# Patient Record
Sex: Male | Born: 1955 | Race: White | Hispanic: No | Marital: Single | Smoking: Former smoker
Health system: Southern US, Community
[De-identification: ages and names within clinical notes are randomized; demographics above are authoritative.]

## PROBLEM LIST (undated history)

## (undated) DIAGNOSIS — S2239XA Fracture of one rib, unspecified side, initial encounter for closed fracture: Secondary | ICD-10-CM

## (undated) DIAGNOSIS — K4090 Unilateral inguinal hernia, without obstruction or gangrene, not specified as recurrent: Secondary | ICD-10-CM

## (undated) DIAGNOSIS — Z8042 Family history of malignant neoplasm of prostate: Secondary | ICD-10-CM

## (undated) DIAGNOSIS — K567 Ileus, unspecified: Secondary | ICD-10-CM

## (undated) DIAGNOSIS — Z87448 Personal history of other diseases of urinary system: Secondary | ICD-10-CM

## (undated) DIAGNOSIS — M109 Gout, unspecified: Secondary | ICD-10-CM

## (undated) DIAGNOSIS — K5792 Diverticulitis of intestine, part unspecified, without perforation or abscess without bleeding: Secondary | ICD-10-CM

## (undated) DIAGNOSIS — G4730 Sleep apnea, unspecified: Secondary | ICD-10-CM

## (undated) DIAGNOSIS — H9190 Unspecified hearing loss, unspecified ear: Secondary | ICD-10-CM

## (undated) DIAGNOSIS — Z820 Family history of epilepsy and other diseases of the nervous system: Secondary | ICD-10-CM

## (undated) DIAGNOSIS — I10 Essential (primary) hypertension: Secondary | ICD-10-CM

## (undated) DIAGNOSIS — N529 Male erectile dysfunction, unspecified: Secondary | ICD-10-CM

## (undated) DIAGNOSIS — I712 Thoracic aortic aneurysm, without rupture: Secondary | ICD-10-CM

## (undated) DIAGNOSIS — E663 Overweight: Secondary | ICD-10-CM

## (undated) DIAGNOSIS — R7303 Prediabetes: Secondary | ICD-10-CM

## (undated) DIAGNOSIS — K219 Gastro-esophageal reflux disease without esophagitis: Secondary | ICD-10-CM

## (undated) HISTORY — DX: Unilateral inguinal hernia, without obstruction or gangrene, not specified as recurrent: K40.90

## (undated) HISTORY — DX: Gout, unspecified: M10.9

## (undated) HISTORY — DX: Family history of epilepsy and other diseases of the nervous system: Z82.0

## (undated) HISTORY — DX: Prediabetes: R73.03

## (undated) HISTORY — DX: Personal history of other diseases of urinary system: Z87.448

## (undated) HISTORY — DX: Family history of malignant neoplasm of prostate: Z80.42

## (undated) HISTORY — DX: Sleep apnea, unspecified: G47.30

## (undated) HISTORY — DX: Overweight: E66.3

## (undated) HISTORY — PX: URETHRA SURGERY: SHX824

## (undated) HISTORY — DX: Male erectile dysfunction, unspecified: N52.9

## (undated) HISTORY — DX: Unspecified hearing loss, unspecified ear: H91.90

---
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
## 2007-03-20 ENCOUNTER — Ambulatory Visit: Payer: Self-pay | Admitting: Urology

## 2008-12-06 ENCOUNTER — Emergency Department: Payer: Self-pay | Admitting: Emergency Medicine

## 2008-12-09 ENCOUNTER — Emergency Department: Payer: Self-pay | Admitting: Internal Medicine

## 2008-12-09 ENCOUNTER — Emergency Department: Payer: Self-pay | Admitting: Emergency Medicine

## 2012-07-03 ENCOUNTER — Ambulatory Visit: Payer: Self-pay | Admitting: General Practice

## 2012-08-12 ENCOUNTER — Ambulatory Visit: Payer: Self-pay | Admitting: General Practice

## 2013-07-09 ENCOUNTER — Ambulatory Visit: Payer: Self-pay | Admitting: Unknown Physician Specialty

## 2013-07-09 LAB — HM COLONOSCOPY

## 2013-07-12 LAB — PATHOLOGY REPORT

## 2016-01-24 LAB — LIPID PANEL
Cholesterol: 198 (ref 0–200)
HDL: 40 (ref 35–70)
LDL Cholesterol: 118
Triglycerides: 202 — AB (ref 40–160)

## 2016-01-24 LAB — PSA: PSA: 0.3

## 2016-08-05 ENCOUNTER — Emergency Department: Payer: BLUE CROSS/BLUE SHIELD

## 2016-08-05 ENCOUNTER — Encounter: Payer: Self-pay | Admitting: Intensive Care

## 2016-08-05 ENCOUNTER — Observation Stay
Admission: EM | Admit: 2016-08-05 | Discharge: 2016-08-08 | Disposition: A | Discharge status: Skilled Nursing Facility | Payer: BLUE CROSS/BLUE SHIELD | Attending: Surgery | Admitting: Surgery

## 2016-08-05 DIAGNOSIS — I10 Essential (primary) hypertension: Secondary | ICD-10-CM | POA: Diagnosis not present

## 2016-08-05 DIAGNOSIS — Z87891 Personal history of nicotine dependence: Secondary | ICD-10-CM | POA: Diagnosis not present

## 2016-08-05 DIAGNOSIS — S2243XA Multiple fractures of ribs, bilateral, initial encounter for closed fracture: Principal | ICD-10-CM

## 2016-08-05 DIAGNOSIS — W11XXXA Fall on and from ladder, initial encounter: Secondary | ICD-10-CM | POA: Insufficient documentation

## 2016-08-05 DIAGNOSIS — K219 Gastro-esophageal reflux disease without esophagitis: Secondary | ICD-10-CM | POA: Diagnosis not present

## 2016-08-05 DIAGNOSIS — S32009A Unspecified fracture of unspecified lumbar vertebra, initial encounter for closed fracture: Secondary | ICD-10-CM

## 2016-08-05 DIAGNOSIS — M62838 Other muscle spasm: Secondary | ICD-10-CM

## 2016-08-05 DIAGNOSIS — I712 Thoracic aortic aneurysm, without rupture: Secondary | ICD-10-CM | POA: Diagnosis not present

## 2016-08-05 DIAGNOSIS — W19XXXA Unspecified fall, initial encounter: Secondary | ICD-10-CM

## 2016-08-05 DIAGNOSIS — S2239XA Fracture of one rib, unspecified side, initial encounter for closed fracture: Secondary | ICD-10-CM

## 2016-08-05 DIAGNOSIS — S2249XA Multiple fractures of ribs, unspecified side, initial encounter for closed fracture: Secondary | ICD-10-CM

## 2016-08-05 HISTORY — DX: Essential (primary) hypertension: I10

## 2016-08-05 HISTORY — DX: Diverticulitis of intestine, part unspecified, without perforation or abscess without bleeding: K57.92

## 2016-08-05 LAB — COMPREHENSIVE METABOLIC PANEL
ALT: 37 U/L (ref 17–63)
ANION GAP: 8 (ref 5–15)
AST: 60 U/L — ABNORMAL HIGH (ref 15–41)
Albumin: 4 g/dL (ref 3.5–5.0)
Alkaline Phosphatase: 71 U/L (ref 38–126)
BILIRUBIN TOTAL: 0.7 mg/dL (ref 0.3–1.2)
BUN: 21 mg/dL — ABNORMAL HIGH (ref 6–20)
CO2: 24 mmol/L (ref 22–32)
Calcium: 8.4 mg/dL — ABNORMAL LOW (ref 8.9–10.3)
Chloride: 104 mmol/L (ref 101–111)
Creatinine, Ser: 1.35 mg/dL — ABNORMAL HIGH (ref 0.61–1.24)
GFR, EST NON AFRICAN AMERICAN: 56 mL/min — AB (ref >60)
Glucose, Bld: 120 mg/dL — ABNORMAL HIGH (ref 65–99)
POTASSIUM: 3.8 mmol/L (ref 3.5–5.1)
Sodium: 136 mmol/L (ref 135–145)
TOTAL PROTEIN: 7.5 g/dL (ref 6.5–8.1)

## 2016-08-05 LAB — CBC WITH DIFFERENTIAL/PLATELET
BASOS PCT: 0 %
Basophils Absolute: 0 10*3/uL (ref 0–0.1)
Eosinophils Absolute: 0.2 10*3/uL (ref 0–0.7)
Eosinophils Relative: 1 %
HEMATOCRIT: 43.3 % (ref 40.0–52.0)
Hemoglobin: 14.8 g/dL (ref 13.0–18.0)
LYMPHS PCT: 8 %
Lymphs Abs: 1.3 10*3/uL (ref 1.0–3.6)
MCH: 29.8 pg (ref 26.0–34.0)
MCHC: 34.3 g/dL (ref 32.0–36.0)
MCV: 86.9 fL (ref 80.0–100.0)
MONO ABS: 0.9 10*3/uL (ref 0.2–1.0)
MONOS PCT: 6 %
NEUTROS ABS: 13.9 10*3/uL — AB (ref 1.4–6.5)
Neutrophils Relative %: 85 %
Platelets: 193 10*3/uL (ref 150–440)
RBC: 4.98 MIL/uL (ref 4.40–5.90)
RDW: 12.4 % (ref 11.5–14.5)
WBC: 16.4 10*3/uL — ABNORMAL HIGH (ref 3.8–10.6)

## 2016-08-05 MED ORDER — OXYCODONE-ACETAMINOPHEN 7.5-325 MG PO TABS: 1.0000 | ORAL_TABLET | ORAL | 0 refills | Status: DC | PRN

## 2016-08-05 MED ORDER — DIAZEPAM 5 MG PO TABS: 5.0000 mg | ORAL_TABLET | Freq: Three times a day (TID) | ORAL | 0 refills | Status: DC | PRN

## 2016-08-05 MED ORDER — HYDROMORPHONE HCL 1 MG/ML IJ SOLN
0.5000 mg | Freq: Once | INTRAMUSCULAR | Status: AC
  Administered 2016-08-05: 0.5 mg via INTRAVENOUS
  Filled 2016-08-05: qty 1

## 2016-08-05 MED ORDER — KETOROLAC TROMETHAMINE 30 MG/ML IJ SOLN
30.0000 mg | Freq: Once | INTRAMUSCULAR | Status: AC
  Administered 2016-08-05: 30 mg via INTRAVENOUS
  Filled 2016-08-05: qty 1

## 2016-08-05 MED ORDER — DEXAMETHASONE SODIUM PHOSPHATE 10 MG/ML IJ SOLN
10.0000 mg | Freq: Once | INTRAMUSCULAR | Status: AC
Start: 2016-08-05 — End: 2016-08-05
  Administered 2016-08-05: 10 mg via INTRAVENOUS
  Filled 2016-08-05: qty 1

## 2016-08-05 MED ORDER — SODIUM CHLORIDE 0.9 % IV SOLN
Freq: Once | INTRAVENOUS | Status: DC
  Administered 2016-08-06: 01:00:00 via INTRAVENOUS

## 2016-08-05 MED ORDER — HYDROMORPHONE HCL 1 MG/ML IJ SOLN
1.0000 mg | Freq: Once | INTRAMUSCULAR | Status: AC
  Administered 2016-08-05: 1 mg via INTRAVENOUS
  Filled 2016-08-05: qty 1

## 2016-08-05 MED ORDER — DIAZEPAM 5 MG PO TABS
10.0000 mg | ORAL_TABLET | Freq: Once | ORAL | Status: AC
  Administered 2016-08-05: 10 mg via ORAL
  Filled 2016-08-05: qty 2

## 2016-08-05 MED ORDER — IOPAMIDOL (ISOVUE-300) INJECTION 61%
75.0000 mL | Freq: Once | INTRAVENOUS | Status: AC | PRN
  Administered 2016-08-05: 75 mL via INTRAVENOUS

## 2016-08-05 MED ORDER — IBUPROFEN 800 MG PO TABS: 800.0000 mg | ORAL_TABLET | Freq: Three times a day (TID) | ORAL | 0 refills | Status: DC | PRN

## 2016-08-05 NOTE — ED Provider Notes (Addendum)
[facility] ([place]) Emergency Department Provider Note        Time seen: ----------------------------------------- 5:40 PM on 08/05/2016 -----------------------------------------    I have reviewed the triage vital signs and the nursing notes.   HISTORY  Chief Complaint Fall and Loss of Consciousness    HPI Arthur Koch is a 61 y.o. male who presents to ER after a fall from a ladder.Patient states that he slipped and caused him to fall. Patient states he landed flat on his back he is complaining of pain in the middle of his back. Patient fell and thinks he might of have loss of consciousness but is not sure how long he was out. He woke and crawled to his house and called 911. He denies any neck pain. 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 He has no radicular symptoms or decreased sensation.   No past medical history on file.  There are no active problems to display for this patient.   No past surgical history on file.  Allergies Patient has no allergy information on record.  Social History Social History  Substance Use Topics  . Smoking status: Not on file  . Smokeless tobacco: Not on file  . Alcohol use Not on file    Review of Systems Constitutional: Negative for fever. Cardiovascular: Negative for chest pain. Respiratory: Negative for shortness of breath. Gastrointestinal: Negative for abdominal pain, vomiting and diarrhea. Genitourinary: Negative for dysuria. Musculoskeletal: Positive for back pain Skin: Negative for rash. Neurological: Negative for headaches, focal weakness or numbness.  10-point ROS otherwise negative.  ____________________________________________   PHYSICAL EXAM:  VITAL SIGNS: ED Triage Vitals [08/05/16 1739]  Enc Vitals Group     BP 136/87     Pulse Rate 79     Resp (!) 94     Temp 98.1 F (36.7 C)     Temp Source Oral     SpO2 94 %     Weight      Height      Head Circumference    Peak Flow      Pain Score      Pain Loc      Pain Edu?      Excl. in [place]?     Constitutional: Alert and oriented. Well appearing and in no distress. Eyes: Conjunctivae are normal. PERRL. Normal extraocular movements. ENT   Head: Normocephalic and atraumatic.   Nose: No congestion/rhinnorhea.   Mouth/Throat: Mucous membranes are moist.   Neck: No stridor. Cardiovascular: Normal rate, regular rhythm. No murmurs, rubs, or gallops. Respiratory: Normal respiratory effort without tachypnea nor retractions. Breath sounds are clear and equal bilaterally. No wheezes/rales/rhonchi. Gastrointestinal: Soft and nontender. Normal bowel sounds Musculoskeletal: Nontender with normal range of motion in all extremities. No lower extremity tenderness nor edema. Mid back tenderness. Negative crossover straight leg raise examination. Neurologic:  Normal speech and language. No gross focal neurologic deficits are appreciated. Strength and sensation appear to be normal Skin:  Skin is warm, dry and intact. No rash noted. Psychiatric: Mood and affect are normal. Speech and behavior are normal.  ____________________________________________  ED COURSE:  Pertinent labs & imaging results that were available during my care of the patient were reviewed by me and considered in my medical decision making (see chart for details). Patient presents to ER after mechanical fall. We will assess with imaging and provide IV pain medicine.  EKG: Interpreted by me, sinus rhythm rate 89 bpm, normal PR interval, normal QRS,  normal QT, leftward axis, possible old inferior infarct   Procedures ____________________________________________   LABS (pertinent positives/negatives)  Labs Reviewed  CBC WITH DIFFERENTIAL/PLATELET - Abnormal; Notable for the following:       Result Value   WBC 16.4 (*)    Neutro Abs 13.9 (*)    All other components within normal limits  COMPREHENSIVE METABOLIC PANEL - Abnormal; Notable  for the following:    Glucose, Bld 120 (*)    BUN 21 (*)    Creatinine, Ser 1.35 (*)    Calcium 8.4 (*)    AST 60 (*)    GFR calc non Af Amer 56 (*)    All other components within normal limits    RADIOLOGY Images were viewed by me  CT head, thoracic and lumbar spine x-rays and CT scans  Did not reveal any acute findings IMPRESSION: 1. Nondisplaced fractures of the left transverse process of L3 and the right transverse process of L4. 2. No other fractures. No other acute abnormalities. IMPRESSION: 1. Multiple bilateral rib fractures. No pneumothorax. No other acute/traumatic intrathoracic pathology. 2. Mild dilatation of the ascending aorta measuring up to 4.4 cm. Ascending thoracic aortic aneurysm. Recommend semi-annual imaging followup by CTA or MRA and referral to cardiothoracic surgery if not already obtained. This recommendation follows 2010 ACCF/AHA/AATS/ACR/ASA/SCA/SCAI/SIR/STS/SVM Guidelines for the Diagnosis and Management of Patients With Thoracic Aortic Disease. Circulation. 2010; 121: [id number] ____________________________________________  FINAL ASSESSMENT AND PLAN  Fall, back pain, muscle spasm, contusion, transverse process fractures, Rib fractures  Plan: Patient with labs and imaging as dictated above. Labs and imaging are reassuring. He was given IV Dilaudid and oral Valium. We have attempted to ambulate in the halls.  Patient required further CT imaging because he was having trouble walking. CT scan showed transverse process fractures and bilateral rib fractures. Patient is requesting admission here rather than transfer to a trauma center Earleen Newport, MD   Note: This note was generated in part or whole with voice recognition software. Voice recognition is usually quite accurate but there are transcription errors that can and very often do occur. I apologize for any typographical errors that were not detected and corrected.     Earleen Newport, MD 08/05/16 2137    Earleen Newport, MD 08/06/16 [id number]    Earleen Newport, MD 08/06/16 [phone]

## 2016-08-05 NOTE — ED Notes (Signed)
Pt refuses to get up at this time. Moaning and groaning. Pt reports "when I take a breath it hurts so bad in my back. I cannot take a good breath due to pain" MD made aware. See mar for medication

## 2016-08-05 NOTE — ED Notes (Signed)
Patient returned from CT without incident.  Will continue to monitor

## 2016-08-05 NOTE — ED Notes (Signed)
Patient transported to X-ray 

## 2016-08-05 NOTE — ED Notes (Signed)
Patient to CT with radiology tech via stretcher. Patient alert and oriented on departure.

## 2016-08-05 NOTE — ED Notes (Signed)
Patient transported to CT 

## 2016-08-05 NOTE — ED Triage Notes (Signed)
PAtient arrived by EMS from home for a fall from a ladder. Patient states "The ladder slipped and caused me to fall. I fell flat on my back" only c/o pain in mid center back. When patient fell he had a LOC and it was an unknown time of how long he was out. Patient woke and crawled to house and called 911. Abrasions present on arms. Denies neck pain. Takes baby aspirin daily.HX HTN and X3 ruptured discs. A&O x3 upon arrival. EMS vitals 150/90, HR 90s, 97O2, bloos sugar 115

## 2016-08-06 ENCOUNTER — Observation Stay: Payer: BLUE CROSS/BLUE SHIELD

## 2016-08-06 DIAGNOSIS — S2243XA Multiple fractures of ribs, bilateral, initial encounter for closed fracture: Secondary | ICD-10-CM | POA: Insufficient documentation

## 2016-08-06 DIAGNOSIS — W19XXXA Unspecified fall, initial encounter: Secondary | ICD-10-CM | POA: Insufficient documentation

## 2016-08-06 DIAGNOSIS — S2249XA Multiple fractures of ribs, unspecified side, initial encounter for closed fracture: Secondary | ICD-10-CM | POA: Diagnosis present

## 2016-08-06 DIAGNOSIS — S32009A Unspecified fracture of unspecified lumbar vertebra, initial encounter for closed fracture: Secondary | ICD-10-CM

## 2016-08-06 DIAGNOSIS — S2239XA Fracture of one rib, unspecified side, initial encounter for closed fracture: Secondary | ICD-10-CM | POA: Diagnosis present

## 2016-08-06 LAB — CBC
HCT: 39.8 % — ABNORMAL LOW (ref 40.0–52.0)
HEMOGLOBIN: 14.2 g/dL (ref 13.0–18.0)
MCH: 30.9 pg (ref 26.0–34.0)
MCHC: 35.7 g/dL (ref 32.0–36.0)
MCV: 86.5 fL (ref 80.0–100.0)
Platelets: 189 10*3/uL (ref 150–440)
RBC: 4.61 MIL/uL (ref 4.40–5.90)
RDW: 12.3 % (ref 11.5–14.5)
WBC: 14.1 10*3/uL — ABNORMAL HIGH (ref 3.8–10.6)

## 2016-08-06 MED ORDER — SODIUM CHLORIDE 0.9% FLUSH: 3.0000 mL | INTRAVENOUS | Status: DC | PRN

## 2016-08-06 MED ORDER — PANTOPRAZOLE SODIUM 40 MG PO TBEC
40.0000 mg | DELAYED_RELEASE_TABLET | Freq: Every day | ORAL | Status: DC
  Administered 2016-08-06 – 2016-08-08 (×3): 40 mg via ORAL
  Filled 2016-08-06 (×3): qty 1

## 2016-08-06 MED ORDER — LISINOPRIL 20 MG PO TABS
20.0000 mg | ORAL_TABLET | Freq: Every day | ORAL | Status: DC
  Administered 2016-08-06 – 2016-08-08 (×3): 20 mg via ORAL
  Filled 2016-08-06 (×3): qty 1

## 2016-08-06 MED ORDER — ASPIRIN EC 81 MG PO TBEC
81.0000 mg | DELAYED_RELEASE_TABLET | Freq: Every day | ORAL | Status: DC
  Administered 2016-08-06 – 2016-08-08 (×3): 81 mg via ORAL
  Filled 2016-08-06 (×3): qty 1

## 2016-08-06 MED ORDER — HYDROCHLOROTHIAZIDE 12.5 MG PO CAPS
12.5000 mg | ORAL_CAPSULE | Freq: Every day | ORAL | Status: DC
  Administered 2016-08-06 – 2016-08-08 (×3): 12.5 mg via ORAL
  Filled 2016-08-06 (×3): qty 1

## 2016-08-06 MED ORDER — HYDROMORPHONE HCL 1 MG/ML IJ SOLN
1.0000 mg | INTRAMUSCULAR | Status: DC | PRN
  Administered 2016-08-07 – 2016-08-08 (×3): 1 mg via INTRAVENOUS
  Filled 2016-08-06 (×3): qty 1

## 2016-08-06 MED ORDER — LISINOPRIL-HYDROCHLOROTHIAZIDE 20-12.5 MG PO TABS: 1.0000 | ORAL_TABLET | Freq: Every day | ORAL | Status: DC

## 2016-08-06 MED ORDER — GUAIFENESIN ER 600 MG PO TB12
600.0000 mg | ORAL_TABLET | Freq: Two times a day (BID) | ORAL | Status: DC | PRN
  Administered 2016-08-06 – 2016-08-07 (×3): 600 mg via ORAL
  Filled 2016-08-06 (×3): qty 1

## 2016-08-06 MED ORDER — SODIUM CHLORIDE 0.9 % IV SOLN: 250.0000 mL | INTRAVENOUS | Status: DC | PRN

## 2016-08-06 MED ORDER — PNEUMOCOCCAL VAC POLYVALENT 25 MCG/0.5ML IJ INJ: 0.5000 mL | INJECTION | INTRAMUSCULAR | Status: DC

## 2016-08-06 MED ORDER — OXYCODONE HCL 5 MG PO TABS
5.0000 mg | ORAL_TABLET | ORAL | Status: DC | PRN
  Administered 2016-08-06 – 2016-08-07 (×6): 5 mg via ORAL
  Filled 2016-08-06 (×6): qty 1

## 2016-08-06 MED ORDER — ONDANSETRON HCL 4 MG PO TABS: 4.0000 mg | ORAL_TABLET | Freq: Four times a day (QID) | ORAL | Status: DC | PRN

## 2016-08-06 MED ORDER — CYCLOBENZAPRINE HCL 10 MG PO TABS
5.0000 mg | ORAL_TABLET | Freq: Three times a day (TID) | ORAL | Status: DC | PRN
  Administered 2016-08-06 – 2016-08-08 (×4): 5 mg via ORAL
  Filled 2016-08-06 (×5): qty 1

## 2016-08-06 MED ORDER — ONDANSETRON HCL 4 MG/2ML IJ SOLN: 4.0000 mg | Freq: Four times a day (QID) | INTRAMUSCULAR | Status: DC | PRN

## 2016-08-06 MED ORDER — HEPARIN SODIUM (PORCINE) 5000 UNIT/ML IJ SOLN
5000.0000 [IU] | Freq: Three times a day (TID) | INTRAMUSCULAR | Status: DC
  Administered 2016-08-06 – 2016-08-08 (×6): 5000 [IU] via SUBCUTANEOUS
  Filled 2016-08-06 (×6): qty 1

## 2016-08-06 MED ORDER — SODIUM CHLORIDE 0.9% FLUSH
3.0000 mL | Freq: Two times a day (BID) | INTRAVENOUS | Status: DC
  Administered 2016-08-06 – 2016-08-07 (×5): 3 mL via INTRAVENOUS

## 2016-08-06 NOTE — Progress Notes (Signed)
Patient ID: Arthur Koch, male   DOB: 03/30/1956, 61 y.o.   MRN: [id number] Patient complains of significant pain on right and left sides of abdomen. Patient also complained of mucus in the back of his throat that he was unable to cough up on his own. Was given mucomist with good relief.     AVSS Tender around the anterior, lower chest on both sides. No apparent external bruising. He does not seem to be comfortable when trying to change positions or when trying to sit up.  Chest xray this morning so no acute changes.  Plan- will need to be mobilized, out of bed- the earlier the better. If he is not any better with pain control he may benefit with a pain management evaluation.  CXR ordered for tomorrow.

## 2016-08-06 NOTE — H&P (Signed)
Arthur Koch is an 61 y.o. male.    Chief Complaint: Chest pain  HPI: This a patient with bilateral posterior chest pain more on the right than left following a fall from a ladder. He's not sure if he lost consciousness but a workup in the ER had a negative CT scan of his head. Patient describes chest pain which is improved now that he's been in the emergency room. He has no shortness of breath he has no head pain or neck pain and he has no extremity pain or abdominal pain.  His only medical problem is reflux disease and hypertension.  Past surgical history is that of a right arm fracture  No family history of significant illness.  He works for [employer] this smoke but drinks 2 beers per day  Past Medical History:  Diagnosis Date  . Diverticulitis   . Hypertension     History reviewed. No pertinent surgical history.  History reviewed. No pertinent family history. Social History:  reports that he has quit smoking. He has quit using smokeless tobacco. He reports that he drinks about 2.4 oz of alcohol per week . His drug history is not on file.  Allergies: No Known Allergies   (Not in a hospital admission)   Review of Systems  Constitutional: Negative.   HENT: Negative.   Eyes: Negative.   Respiratory: Negative for cough, hemoptysis, sputum production, shortness of breath and wheezing.   Cardiovascular: Positive for chest pain. Negative for palpitations, orthopnea, claudication and leg swelling.  Gastrointestinal: Negative.   Genitourinary: Negative.   Musculoskeletal: Negative.   Skin: Negative.   Neurological: Negative.   Endo/Heme/Allergies: Negative.   Psychiatric/Behavioral: Negative.      Physical Exam:  BP 121/61   Pulse 96   Temp 98.1 F (36.7 C) (Oral)   Resp 20   Ht 5' 11.5" (1.816 m)   Wt 256 lb 1.6 oz (116.2 kg)   SpO2 97%   BMI 35.22 kg/m   Physical Exam  Constitutional: He is oriented to person, place, and time and  well-developed, well-nourished, and in no distress. No distress.  Appears comfortable not obviously short of breath nor in pain  HENT:  Head: Normocephalic and atraumatic.  Eyes: Pupils are equal, round, and reactive to light. Right eye exhibits no discharge. Left eye exhibits no discharge. No scleral icterus.  Neck: Normal range of motion.  Cardiovascular: Normal rate, regular rhythm and normal heart sounds.   Pulmonary/Chest: Effort normal and breath sounds normal. No respiratory distress. He has no wheezes. He has no rales. He exhibits tenderness.  Abdominal: Soft. He exhibits no distension. There is no tenderness.  Musculoskeletal: Normal range of motion. He exhibits no edema or tenderness.  Lymphadenopathy:    He has no cervical adenopathy.  Neurological: He is alert and oriented to person, place, and time.  Skin: Skin is warm and dry. He is not diaphoretic. No erythema.  Right arm abrasions  Psychiatric: Mood and affect normal.  Vitals reviewed.       Results for orders placed or performed during the hospital encounter of 08/05/16 (from the past 48 hour(s))  CBC with Differential/Platelet     Status: Abnormal   Collection Time: 08/05/16  5:45 PM  Result Value Ref Range   WBC 16.4 (H) 3.8 - 10.6 K/uL   RBC 4.98 4.40 - 5.90 MIL/uL   Hemoglobin 14.8 13.0 - 18.0 g/dL   HCT 43.3 40.0 - 52.0 %   MCV 86.9 80.0 -  100.0 fL   MCH 29.8 26.0 - 34.0 pg   MCHC 34.3 32.0 - 36.0 g/dL   RDW 12.4 11.5 - 14.5 %   Platelets 193 150 - 440 K/uL   Neutrophils Relative % 85 %   Neutro Abs 13.9 (H) 1.4 - 6.5 K/uL   Lymphocytes Relative 8 %   Lymphs Abs 1.3 1.0 - 3.6 K/uL   Monocytes Relative 6 %   Monocytes Absolute 0.9 0.2 - 1.0 K/uL   Eosinophils Relative 1 %   Eosinophils Absolute 0.2 0 - 0.7 K/uL   Basophils Relative 0 %   Basophils Absolute 0.0 0 - 0.1 K/uL  Comprehensive metabolic panel     Status: Abnormal   Collection Time: 08/05/16  5:45 PM  Result Value Ref Range   Sodium 136  135 - 145 mmol/L   Potassium 3.8 3.5 - 5.1 mmol/L   Chloride 104 101 - 111 mmol/L   CO2 24 22 - 32 mmol/L   Glucose, Bld 120 (H) 65 - 99 mg/dL   BUN 21 (H) 6 - 20 mg/dL   Creatinine, Ser 1.35 (H) 0.61 - 1.24 mg/dL   Calcium 8.4 (L) 8.9 - 10.3 mg/dL   Total Protein 7.5 6.5 - 8.1 g/dL   Albumin 4.0 3.5 - 5.0 g/dL   AST 60 (H) 15 - 41 U/L   ALT 37 17 - 63 U/L   Alkaline Phosphatase 71 38 - 126 U/L   Total Bilirubin 0.7 0.3 - 1.2 mg/dL   GFR calc non Af Amer 56 (L) >60 mL/min   GFR calc Af Amer >60 >60 mL/min    Comment: (NOTE) The eGFR has been calculated using the CKD EPI equation. This calculation has not been validated in all clinical situations. eGFR's persistently <60 mL/min signify possible Chronic Kidney Disease.    Anion gap 8 5 - 15   Dg Chest 1 View  Result Date: 08/05/2016 CLINICAL DATA:  Back pain after fall off a ladder EXAM: CHEST 1 VIEW COMPARISON:  08/05/2016 FINDINGS: Low lung volumes. No acute infiltrate or effusion. Cardiomediastinal silhouette within normal limits. No pneumothorax. IMPRESSION: Low lung volumes. No radiographic evidence for acute cardiopulmonary abnormality. Electronically Signed   By: Donavan Foil M.D.   On: 08/05/2016 22:57   Dg Thoracic Spine 2 View  Result Date: 08/05/2016 CLINICAL DATA:  [place] about 67f off ladder, landed flat on his back. Mid>lower back pain without leg pain or tingling.No previous injury to lower back EXAM: THORACIC SPINE 2 VIEWS COMPARISON:  None. FINDINGS: No fracture. No spondylolisthesis. There are endplate osteophytes along the mid to lower thoracic spine. No bone lesion. The soft tissues are unremarkable. IMPRESSION: No fracture, spondylolisthesis or acute finding. Electronically Signed   By: DLajean ManesM.D.   On: 08/05/2016 18:29   Dg Lumbar Spine 2-3 Views  Result Date: 08/05/2016 CLINICAL DATA:  [place] 848f[place], landed flat on his back. Mid>lower back pain without leg pain or tingling.No previous injury  to lower back EXAM: LUMBAR SPINE - 2-3 VIEW COMPARISON:  Lumbar MRI, 08/12/2012 FINDINGS: No acute fracture.  No bone lesion.  No spondylolisthesis. Mild loss disc height at L2-L3 and L3-L4. Moderate loss disc height at L4-L5 and L5-S1. There are minor endplate osteophytes most evident at L5-S1. Facet degenerative changes noted at L5-S1. The soft tissues are unremarkable. IMPRESSION: No fracture or acute finding. Electronically Signed   By: DaLajean Manes.D.   On: 08/05/2016 18:31   Ct Head Wo Contrast  Result Date: 08/05/2016 CLINICAL DATA:  [place] about 30f off ladder, landed flat on his back. Mid>lower back pain without leg pain or tingling.No previous injury to lower back EXAM: CT HEAD WITHOUT CONTRAST TECHNIQUE: Contiguous axial images were obtained from the base of the skull through the vertex without intravenous contrast. COMPARISON:  None. FINDINGS: Brain: No evidence of acute infarction, hemorrhage, hydrocephalus, extra-axial collection or mass lesion/mass effect. Vascular: No hyperdense vessel or unexpected calcification. Skull: Normal. Negative for fracture or focal lesion. Sinuses/Orbits: Mild ethmoid and maxillary sinus mucosal thickening. No sinus air-fluid levels. Clear mastoid air cells. Unremarkable globes and orbits. Other: None. IMPRESSION: 1. No intracranial abnormality.  No skull fracture. Electronically Signed   By: DLajean ManesM.D.   On: 08/05/2016 18:33   Ct Chest W Contrast  Result Date: 08/06/2016 CLINICAL DATA:  61year old male with fall and back pain. EXAM: CT CHEST WITH CONTRAST TECHNIQUE: Multidetector CT imaging of the chest was performed during intravenous contrast administration. CONTRAST:  76mISOVUE-300 IOPAMIDOL (ISOVUE-300) INJECTION 61% COMPARISON:  Chest radiograph dated 08/05/2016 and thoracic spine CT dated 08/05/2016 FINDINGS: Cardiovascular: there is no cardiomegaly or pericardial effusion. Mild dilatation of the ascending aorta measuring up to 4.4 cm. There is  no aneurysmal dilatation or evidence of dissection. The origins of the great vessels of the aortic arch appear patent. The central pulmonary arteries are grossly unremarkable. Mediastinum/Nodes: There is no hilar or mediastinal adenopathy. The esophagus and the thyroid gland are grossly unremarkable. Lungs/Pleura: There are bibasilar subpleural atelectasis/scarring. The lungs are otherwise clear. There is no pleural effusion or pneumothorax. Mild bilateral lower lobe bronchiectatic changes. The central airways are patent. New Upper Abdomen: No acute abnormality. Musculoskeletal: Mild degenerative changes of the spine. The there is nondisplaced fracture of the left anterior 4-9 ribs. There is also minimally displaced fracture of the right anterior4-8th ribs. IMPRESSION: 1. Multiple bilateral rib fractures. No pneumothorax. No other acute/traumatic intrathoracic pathology. 2. Mild dilatation of the ascending aorta measuring up to 4.4 cm. Ascending thoracic aortic aneurysm. Recommend semi-annual imaging followup by CTA or MRA and referral to cardiothoracic surgery if not already obtained. This recommendation follows 2010 ACCF/AHA/AATS/ACR/ASA/SCA/SCAI/SIR/STS/SVM Guidelines for the Diagnosis and Management of Patients With Thoracic Aortic Disease. Circulation. 2010; 121: : [id number] Signed   By: ArAnner Crete.D.   On: 08/06/2016 00:06   Ct Thoracic Spine Wo Contrast  Result Date: 08/05/2016 CLINICAL DATA:  Pt fell from a ladder today landing flat on his back, severe back pain, trouble breathing, unable to get up from supine position, moaning in pain EXAM: CT THORACIC SPINE WITHOUT CONTRAST; CT LUMBAR SPINE WITHOUT CONTRAST TECHNIQUE: Multidetector CT images of the thoracic were obtained using the standard protocol without intravenous contrast. COMPARISON:  Current thoracic and lumbar spine radiographs. Lumbar MRI, 08/12/2012. FINDINGS: Alignment: Normal. Vertebrae: No thoracic fracture. No  fracture of the visualized ribs. There are nondisplaced fractures of the left L3 transverse process and the right L4 transverse process. No other lumbar spine fractures. No bone lesions. Paraspinal and other soft tissues: No thoracic disc herniation or significant disc bulging. The thoracic spinal canal is well preserved. No evidence of hematoma. The neural foramina are well preserved. Mild spondylotic disc bulging noted from L2-L3 through L5-S1. Mild facet degenerative changes noted bilaterally at L5-S1. There is neural foraminal narrowing bilaterally at L4-L5 and L5-S1, greatest on the right at L5-S1. No disc herniation. No epidural hematoma. Disc levels: There are small endplate osteophytes throughout most of the thoracic spine. The thoracic disc  spaces are relatively well preserved. There is mild loss of disc height at L2-L3 and L3-L4 with mild to moderate loss of disc height at L4-L5 and moderate loss of disc height at L5-S1. No surrounding soft tissue mass or hematoma. There is mild opacity in the visualize lungs that is consistent with atelectasis. No pleural effusion. IMPRESSION: 1. Nondisplaced fractures of the left transverse process of L3 and the right transverse process of L4. 2. No other fractures.  No other acute abnormalities. Electronically Signed   By: Lajean Manes M.D.   On: 08/05/2016 21:24   Ct Lumbar Spine Wo Contrast  Result Date: 08/05/2016 CLINICAL DATA:  Pt fell from a ladder today landing flat on his back, severe back pain, trouble breathing, unable to get up from supine position, moaning in pain EXAM: CT THORACIC SPINE WITHOUT CONTRAST; CT LUMBAR SPINE WITHOUT CONTRAST TECHNIQUE: Multidetector CT images of the thoracic were obtained using the standard protocol without intravenous contrast. COMPARISON:  Current thoracic and lumbar spine radiographs. Lumbar MRI, 08/12/2012. FINDINGS: Alignment: Normal. Vertebrae: No thoracic fracture. No fracture of the visualized ribs. There are  nondisplaced fractures of the left L3 transverse process and the right L4 transverse process. No other lumbar spine fractures. No bone lesions. Paraspinal and other soft tissues: No thoracic disc herniation or significant disc bulging. The thoracic spinal canal is well preserved. No evidence of hematoma. The neural foramina are well preserved. Mild spondylotic disc bulging noted from L2-L3 through L5-S1. Mild facet degenerative changes noted bilaterally at L5-S1. There is neural foraminal narrowing bilaterally at L4-L5 and L5-S1, greatest on the right at L5-S1. No disc herniation. No epidural hematoma. Disc levels: There are small endplate osteophytes throughout most of the thoracic spine. The thoracic disc spaces are relatively well preserved. There is mild loss of disc height at L2-L3 and L3-L4 with mild to moderate loss of disc height at L4-L5 and moderate loss of disc height at L5-S1. No surrounding soft tissue mass or hematoma. There is mild opacity in the visualize lungs that is consistent with atelectasis. No pleural effusion. IMPRESSION: 1. Nondisplaced fractures of the left transverse process of L3 and the right transverse process of L4. 2. No other fractures.  No other acute abnormalities. Electronically Signed   By: Lajean Manes M.D.   On: 08/05/2016 21:24     Assessment/Plan  Labs are reviewed CT scans reviewed.  Head CT is normal chest CTs shows bilateral rib fractures with no pneumothorax and an ectatic aorta. No sign Of leak. Transverse process fractures of the lumbar L3 nondisplaced  Patient with a fall with no loss of consciousness and a negative CT scan. He will be admitted for observation and pain control with a repeat chest x-ray and repeat labs. He appears comfortable at this point and can be placed on the surgical floor.  Florene Glen, MD, FACS

## 2016-08-06 NOTE — Progress Notes (Signed)
Pt c/o lower back muscle spasms and wanted a muscle relaxant, Dr Burt Knack paged and ordered for Flexeril 5mg  TID prn. Will administer as ordered.

## 2016-08-06 NOTE — ED Notes (Signed)
Patient resting with family at bedside. Denies need for anything at this time. States pain is "pretty good" and rates it a 3/10. Updated on admission process.

## 2016-08-06 NOTE — Progress Notes (Signed)
New Admission Note:   Arrival Method: per stretcher from ED, pt came from home after a fall Mental Orientation: alert and oriented X4 Telemetry: none ordered Assessment: Completed Skin: warm, dry, with multiple abrasions noted on both arms and elbows sustained from the fall IV: G20 on the left upper arm with transparent dressing, intact Pain: 4/10 scale on the lower back, will administer PRN pain medicine Safety Measures: Safety Fall Prevention Plan has been given and discussed Admission: Completed 1A Orientation: Patient has been oriented to the room, unit and staff.  Family: sister and significant other at bedside  Orders have been reviewed and implemented. Will continue to monitor patient. Call light has been placed within reach and bed alarm has been activated.   Georgeanna Harrison BSN, RN [facility] 1A

## 2016-08-06 NOTE — Progress Notes (Signed)
Pt is experiencing coughing episodes that cause him a lot of pain. Pt states he feels like he has some phlegm that he "just can't get out" Dr. Jamal Collin notified, orders to administer PRN PO mucinex received.

## 2016-08-07 ENCOUNTER — Observation Stay: Payer: BLUE CROSS/BLUE SHIELD

## 2016-08-07 LAB — CBC
HEMATOCRIT: 39.6 % — AB (ref 40.0–52.0)
HEMOGLOBIN: 13.5 g/dL (ref 13.0–18.0)
MCH: 30.1 pg (ref 26.0–34.0)
MCHC: 34.2 g/dL (ref 32.0–36.0)
MCV: 88.1 fL (ref 80.0–100.0)
Platelets: 198 10*3/uL (ref 150–440)
RBC: 4.49 MIL/uL (ref 4.40–5.90)
RDW: 12.5 % (ref 11.5–14.5)
WBC: 17.2 10*3/uL — ABNORMAL HIGH (ref 3.8–10.6)

## 2016-08-07 LAB — HIV ANTIBODY (ROUTINE TESTING W REFLEX): HIV SCREEN 4TH GENERATION: NONREACTIVE

## 2016-08-07 MED ORDER — LIDOCAINE 5 % EX PTCH
3.0000 | MEDICATED_PATCH | CUTANEOUS | Status: DC
  Administered 2016-08-07: 3 via TRANSDERMAL
  Filled 2016-08-07 (×3): qty 3

## 2016-08-07 MED ORDER — IBUPROFEN 400 MG PO TABS
800.0000 mg | ORAL_TABLET | Freq: Three times a day (TID) | ORAL | Status: DC | PRN
  Administered 2016-08-07 – 2016-08-08 (×2): 800 mg via ORAL
  Filled 2016-08-07 (×2): qty 2

## 2016-08-07 MED ORDER — OXYCODONE HCL 5 MG PO TABS
5.0000 mg | ORAL_TABLET | ORAL | Status: DC | PRN
  Administered 2016-08-08 (×2): 10 mg via ORAL
  Filled 2016-08-07 (×3): qty 2

## 2016-08-07 NOTE — Progress Notes (Signed)
This Probation officer received a note that pt's sister called and wanted to talk to me.  This Probation officer secured pt's permission to speak to his sister, physical therapy eval in progress at this time. This Probation officer spoke to pt's sister Arthur Koch, and explained pt's plan of care, explained dr. Has not yet rounded on pt, explained what is needed for pt to have possible rehab, insurance company involvement with social work, Social research officer, government. Sister stated that pt did not like to be alone when he feels this bad, and that the family normally has a schedule to be present but she was unable to be here this morning. Of note, pt stated that he was successful in expectorating sputum and feels much better. Family member now at bedside.

## 2016-08-07 NOTE — Progress Notes (Signed)
This Probation officer entered pt's room to give pain meds, found pt to be asleep with sister at bedside. Sister asked that pt not be awakened at this time for medication.

## 2016-08-07 NOTE — Clinical Social Work Note (Signed)
Clinical Social Work Assessment  Patient Details  Name: Arthur Koch MRN: [id number] Date of Birth: [birth year]  Date of referral:  08/07/16               Reason for consult:  Facility Placement                Permission sought to share information with:  Chartered certified accountant granted to share information::  Yes, Verbal Permission Granted  Name::      [place]::   [place]   Relationship::     Contact Information:     Housing/Transportation Living arrangements for the past 2 months:  [place] of Information:  Patient, Adult Children Patient Interpreter Needed:  None Criminal Activity/Legal Involvement Pertinent to Current Situation/Hospitalization:  No - Comment as needed Significant Relationships:  Adult Children, Siblings Lives with:  Adult Children Do you feel safe going back to the place where you live?  Yes Need for family participation in patient care:  Yes (Comment)  Care giving concerns:  Patient lives in [place] with his son Arthur Koch.    Social Worker assessment / plan:  Holiday representative (CSW) received verbal consult from PT that recommendation is SNF. CSW met with patient and his son Arthur Koch, daughter and sister were at bedside. Patient was sitting up in the chair and appeared to be in pain. CSW introduced self and explained role of CSW department. Patient reported that he lives in [place] with his son. CSW explained that PT is recommending SNF and that [facility] will have to approve SNF. CSW explained that [place] may deny SNF and in that case patient would have to go home with home health. Patient verbalized his understanding and is agreeable to SNF search in [place]. FL2 complete and faxed out. CSW presented bed offers to patient and he chose [facility]. [facility] liaison is aware of accepted bed offer and will start [place] authorization today. CSW will continue to follow and assist as needed.    Employment status:  Retired Forensic scientist:  Managed Care PT Recommendations:  [place] / Referral to community resources:  [facility]  Patient/Family's Response to care:  Patient is agreeable to going to [facility].   Patient/Family's Understanding of and Emotional Response to Diagnosis, Current Treatment, and Prognosis:  Patient and family were pleasant and thanked CSW for assistance.   Emotional Assessment Appearance:  Appears stated age Attitude/Demeanor/Rapport:    Affect (typically observed):  Accepting, Adaptable, Pleasant Orientation:  Oriented to Self, Oriented to Place, Oriented to  Time, Oriented to Situation Alcohol / Substance use:  Not Applicable Psych involvement (Current and /or in the community):  No (Comment)  Discharge Needs  Concerns to be addressed:  Discharge Planning Concerns Readmission within the last 30 days:  No Current discharge risk:  Dependent with Mobility Barriers to Discharge:  Continued Medical Work up   [employer], [name], LCSW 08/07/2016, 1:37 PM

## 2016-08-07 NOTE — Progress Notes (Signed)
Dr. Adonis Huguenin paged, this writer received order for motrin po for pt. PT eval completed.

## 2016-08-07 NOTE — Progress Notes (Signed)
Pt received pain medication at this time. Pt is lying in bed on his L side, R arm on the trapeze. Pt stated that his problem is that he feels like he cannot bring up what is in his chest, and trying to cough to do this causes more pain. "I think you"all are just going to let me lay here and die". This Probation officer offered to help pt reposition and pt refused, stating his son was coming in a few minutes and would help him. Pt asking why he has not seen a doctor yet. This Probation officer explained that his doctor has not rounded on him yet.

## 2016-08-07 NOTE — Evaluation (Signed)
Physical Therapy Evaluation Patient Details Name: AYAL LAGOW MRN: [id number] DOB: Jun 03, 1956 Today's Date: 08/07/2016   History of Present Illness  61 yo male with onset of fractures on L L3 and R L4 transverse processes, B multilevel rib fractures,  multiple disc bulges and osteophytes in lumbar spine who fell onto elbows at home, now admitted OBS for manamgemetn of pain and to assess mobility.  PMHx:  HTN, GERD  Clinical Impression  Pt is up to walk with PT after lengthy process to get up to side of bed, then had to get clothing changed and cleaned up due to bowel movement.  He is able to assist to chair, needs help for all movement out of bed with 2 but then one for actual transfers.  Will be appropriate for SNF with his pain related limitations but is scheduled to try stairs and work toward a transition home.  Nursing and SW, case management, were informed about the plans and will work acutely to progress as much as possible with mobility including stairs.    Follow Up Recommendations SNF    Equipment Recommendations  Rolling walker with 5" wheels    Recommendations for Other Services OT consult     Precautions / Restrictions Precautions Precautions: Back;Fall Restrictions Weight Bearing Restrictions: No      Mobility  Bed Mobility Overal bed mobility: Needs Assistance Bed Mobility: Supine to Sit     Supine to sit: Mod assist;+2 for physical assistance;+2 for safety/equipment;HOB elevated     General bed mobility comments: using a trapeze bar and bed pad to pivot to side of bed  Transfers Overall transfer level: Needs assistance Equipment used: Rolling walker (2 wheeled);1 person hand held assist Transfers: Sit to/from Stand;Stand Pivot Transfers Sit to Stand: From elevated surface;Min assist Stand pivot transfers: Min assist;From elevated surface          Ambulation/Gait Ambulation/Gait assistance: Min assist Ambulation Distance (Feet): 5 Feet Assistive  device: Rolling walker (2 wheeled);1 person hand held assist Gait Pattern/deviations: Step-through pattern;Step-to pattern;Wide base of support;Trunk flexed Gait velocity: reduced Gait velocity interpretation: Below normal speed for age/gender    Stairs            Wheelchair Mobility    Modified Rankin (Stroke Patients Only)       Balance Overall balance assessment: History of Falls                                           Pertinent Vitals/Pain Pain Assessment: 0-10 Pain Score: 10-Worst pain ever Pain Location: 10 with cough in ribs, 6-7 at rest Pain Descriptors / Indicators: Sharp Pain Intervention(s): Monitored during session;Premedicated before session;Repositioned;Limited activity within patient's tolerance    Home Living Family/patient expects to be discharged to:: Unsure Living Arrangements: Spouse/significant other;Parent               Additional Comments: has level home and 5 steps R rail to enter    Prior Function Level of Independence: Independent         Comments: has been working for [employer]        Extremity/Trunk Assessment   Upper Extremity Assessment Upper Extremity Assessment: Overall WFL for tasks assessed    Lower Extremity Assessment Lower Extremity Assessment: Overall WFL for tasks assessed    Cervical / Trunk Assessment Cervical / Trunk Assessment: Normal  Communication   Communication: No difficulties  Cognition Arousal/Alertness: Awake/alert Behavior During Therapy: WFL for tasks assessed/performed Overall Cognitive Status: Within Functional Limits for tasks assessed                      General Comments General comments (skin integrity, edema, etc.): Pt is up to chair with propped posture, mainly in pain with coughing and struggling to manage pain with the ribcage.  Gave pt a pillow and instructions for splinting back    Exercises     Assessment/Plan     PT Assessment Patient needs continued PT services  PT Problem List Decreased strength;Decreased range of motion;Decreased activity tolerance;Decreased balance;Decreased mobility;Decreased knowledge of use of DME;Decreased safety awareness;Decreased knowledge of precautions;Decreased skin integrity;Pain       PT Treatment Interventions DME instruction;Gait training;Stair training;Functional mobility training;Therapeutic activities;Therapeutic exercise;Balance training;Neuromuscular re-education;Patient/family education    PT Goals (Current goals can be found in the Care Plan section)  Acute Rehab PT Goals Patient Stated Goal: to get pain managed PT Goal Formulation: With patient/family Time For Goal Achievement: 08/21/16 Potential to Achieve Goals: Good    Frequency Min 2X/week   Barriers to discharge Inaccessible home environment stairs to enter house    Co-evaluation               End of Session   Activity Tolerance: Patient limited by pain;Patient tolerated treatment well Patient left: in chair;with call bell/phone within reach;with family/visitor present Nurse Communication: Mobility status;Other (comment);Patient requests pain meds (pt asking for ibuprofen or other for inflammation) PT Visit Diagnosis: Pain;Unsteadiness on feet (R26.81) Pain - Right/Left: Right (Bilateral) Pain - part of body:  (back, ribs)    Functional Assessment Tool Used: Clinical judgement Functional Limitation: Mobility: Walking and moving around Mobility: Walking and Moving Around Current Status [id number]): At least 60 percent but less than 80 percent impaired, limited or restricted Mobility: Walking and Moving Around Goal Status [phone]): At least 1 percent but less than 20 percent impaired, limited or restricted    Time: 1144-1235 PT Time Calculation (min) (ACUTE ONLY): 51 min   Charges:   PT Evaluation $PT Eval Moderate Complexity: 1 Procedure PT Treatments $Gait Training: 8-22  mins $Therapeutic Activity: 8-22 mins   PT G Codes:   PT G-Codes **NOT FOR INPATIENT CLASS** Functional Assessment Tool Used: Clinical judgement Functional Limitation: Mobility: Walking and moving around Mobility: Walking and Moving Around Current Status [id number]): At least 60 percent but less than 80 percent impaired, limited or restricted Mobility: Walking and Moving Around Goal Status [phone]): At least 1 percent but less than 20 percent impaired, limited or restricted     Ramond Dial 08/07/2016, 1:18 PM   Mee Hives, PT MS Acute Rehab Dept. Number: [place] and [place]

## 2016-08-07 NOTE — Progress Notes (Signed)
Pt stated he is unable to eat breakfast because if he sits up, he cant stop coughing. Pt required assistance from staff to lie on his L side, pt could be heard yelling in pain with movement from the hallway. This Probation officer called Dr. Adonis Huguenin and received order for physical therapy eval. thsi writer notified pt that Dr. Adonis Huguenin will be rounding on him today and that physical therapy will be evaluating him as well. Pt took his am meds while lying on his l side, also stated that he feels like he has sputum to bring up but is unable to. Call bell in reach. Pt is taking po fluids well.

## 2016-08-07 NOTE — Clinical Social Work Placement (Signed)
   CLINICAL SOCIAL WORK PLACEMENT  NOTE  Date:  08/07/2016  Patient Details  Name: ASSANTE LAPRAIRIE MRN: [id number] Date of Birth: 08/15/1955  Clinical Social Work is seeking post-discharge placement for this patient at the [place] level of care (*CSW will initial, date and re-position this form in  chart as items are completed):  Yes   Patient/family provided with [place] Work Department's list of facilities offering this level of care within the geographic area requested by the patient (or if unable, by the patient's family).  Yes   Patient/family informed of their freedom to choose among providers that offer the needed level of care, that participate in Medicare, Medicaid or managed care program needed by the patient, have an available bed and are willing to accept the patient.  Yes   Patient/family informed of [facility]'s ownership interest in [facility] and [facility], as well as of the fact that they are under no obligation to receive care at these facilities.  PASRR submitted to EDS on 08/07/16     PASRR number received on 08/07/16     Existing PASRR number confirmed on       FL2 transmitted to all facilities in geographic area requested by pt/family on 08/07/16     FL2 transmitted to all facilities within larger geographic area on       Patient informed that his/her managed care company has contracts with or will negotiate with certain facilities, including the following:        Yes   Patient/family informed of bed offers received.  Patient chooses bed at  ([facility] )     Physician recommends and patient chooses bed at      Patient to be transferred to   on  .  Patient to be transferred to facility by       Patient family notified on   of transfer.  Name of family member notified:        PHYSICIAN       Additional Comment:    _______________________________________________ Arlisa Leclere, [name], LCSW 08/07/2016, 1:36  PM

## 2016-08-07 NOTE — Progress Notes (Addendum)
Shift assessment completed. Pt is alert and oriented, stated he is in significant pain with movement. Pt is on room air, lungs clear bilat, pt stated that he does cough up some phlegm, was just getting over a chest cold prior to fall. HR is regular, abdomen is soft, bs heard. Pt is voiding in urinal,ppp, no edema noted. PIV #20 intact to lac with site free of redness and swelling. Pt received oxycodone, flexeril, and mucinex at time of assessment. This Probation officer discussed with pt that his sister had already called this morning and asked that pt be discharged to a rehab for 20 days. This Probation officer explained that this would be unusual, and that this probably would not be supported by insurance company due to pt's admission under observation status.If physical therapy or doctor feel pt needs this, social work will be consulted. Pt loudly vocalizing from pain with movement, using trapeze bar to assist with sitting up in bed, stating that if bed is allowed to just move him automatically, it is too painful. Pt is encouraged to deep breathe through the pain, and is encouraged to deep breathe and cough as well. This Probation officer further explained that the longer pt lies still without moving, the worse the pain will be when he does move. Pt did c/o his muscles in his chest especially feeling tight, asked if there was some medicated crea, that could be applied to relieve this. This Probation officer explained that pt has no order for this, can be pursued with physician on rounds. Breakfast ordered for pt.

## 2016-08-07 NOTE — NC FL2 (Signed)
[place] LEVEL OF CARE SCREENING TOOL     IDENTIFICATION  Patient Name: Arthur Koch Birthdate: September 01, 1955 Sex: male Admission Date (Current Location): 08/05/2016  [place] and [place] Number:  Engineering geologist and Address:  [facility], [address] [place], [place] [address]      Provider Number: [id number]  Attending Physician Name and Address:  Florene Glen, MD  Relative Name and Phone Number:       Current Level of Care: Hospital Recommended Level of Care: [place] Prior Approval Number:    Date Approved/Denied:   PASRR Number:  ([id number])  Discharge Plan: SNF    Current Diagnoses: Patient Active Problem List   Diagnosis Date Noted  . Rib fractures 08/06/2016  . Multiple closed fractures of ribs of both sides   . Closed fracture of transverse process of lumbar vertebra ([place])   . Fall     Orientation RESPIRATION BLADDER Height & Weight     Self, Time, Situation, Place  Normal Continent Weight: 256 lb 1.6 oz (116.2 kg) Height:  5' 11.5" (181.6 cm)  BEHAVIORAL SYMPTOMS/MOOD NEUROLOGICAL BOWEL NUTRITION STATUS   (none)  (none) Continent Diet (Regular Diet )  AMBULATORY STATUS COMMUNICATION OF NEEDS Skin   Extensive Assist Verbally Normal                       Personal Care Assistance Level of Assistance  Bathing, Feeding, Dressing Bathing Assistance: Limited assistance Feeding assistance: Independent Dressing Assistance: Limited assistance     Functional Limitations Info  Sight, Hearing, Speech Sight Info: Adequate Hearing Info: Adequate Speech Info: Adequate    SPECIAL CARE FACTORS FREQUENCY  PT (By licensed PT), OT (By licensed OT)     PT Frequency:  (5) OT Frequency:  (5)            Contractures      Additional Factors Info  Code Status, Allergies Code Status Info:  (Full Code. ) Allergies Info:  (No Known Allergies. )           Current Medications  (08/07/2016):  This is the current hospital active medication list Current Facility-Administered Medications  Medication Dose Route Frequency Provider Last Rate Last Dose  . 0.9 %  sodium chloride infusion  250 mL Intravenous PRN Florene Glen, MD      . aspirin EC tablet 81 mg  81 mg Oral Daily Florene Glen, MD   81 mg at 08/07/16 [id number]  . cyclobenzaprine (FLEXERIL) tablet 5 mg  5 mg Oral TID PRN Florene Glen, MD   5 mg at 08/07/16 [id number]  . guaiFENesin (MUCINEX) 12 hr tablet 600 mg  600 mg Oral BID PRN Christene Lye, MD   600 mg at 08/07/16 0738  . heparin injection 5,000 Units  5,000 Units Subcutaneous Q8H Florene Glen, MD   5,000 Units at 08/07/16 [phone]  . lisinopril (PRINIVIL,ZESTRIL) tablet 20 mg  20 mg Oral Daily Florene Glen, MD   20 mg at 08/07/16 [id number]   And  . hydrochlorothiazide (MICROZIDE) capsule 12.5 mg  12.5 mg Oral Daily Florene Glen, MD   12.5 mg at 08/07/16 0925  . HYDROmorphone (DILAUDID) injection 1 mg  1 mg Intravenous Q2H PRN Florene Glen, MD   1 mg at 08/07/16 0327  . ondansetron (ZOFRAN) tablet 4 mg  4 mg Oral Q6H PRN Florene Glen, MD  Or  . ondansetron (ZOFRAN) injection 4 mg  4 mg Intravenous Q6H PRN Florene Glen, MD      . oxyCODONE (Oxy IR/ROXICODONE) immediate release tablet 5 mg  5 mg Oral Q4H PRN Florene Glen, MD   5 mg at 08/07/16 1127  . pantoprazole (PROTONIX) EC tablet 40 mg  40 mg Oral Daily Florene Glen, MD   40 mg at 08/07/16 0925  . pneumococcal 23 valent vaccine (PNU-IMMUNE) injection 0.5 mL  0.5 mL Intramuscular Tomorrow-1000 Florene Glen, MD      . sodium chloride flush (NS) 0.9 % injection 3 mL  3 mL Intravenous Q12H Florene Glen, MD   3 mL at 08/06/16 2214  . sodium chloride flush (NS) 0.9 % injection 3 mL  3 mL Intravenous PRN Florene Glen, MD         Discharge Medications: Please see discharge summary for a list of discharge medications.  Relevant Imaging Results:  Relevant Lab  Results:   Additional Information  (SSN: [id number])  Malashia Kamaka, [name], LCSW

## 2016-08-07 NOTE — Progress Notes (Signed)
Received a call from pt's sister Raynelle Highland, requesting pt to go for rehab once discharged from the facility. Sticky notes for MD updated about family's request.

## 2016-08-07 NOTE — Progress Notes (Signed)
Instructed family with incentive spirometer, family member stated that she understands how to use and will help patient when he awakes, will call if needs assistance.

## 2016-08-07 NOTE — Progress Notes (Signed)
CC: Chest pain Subjective: Patient with bilateral posterior chest pain worse on the right following a fall from ladder. Continues to complain of pain. He has been out of bed and worked with physical therapy today. Not taking deep breaths.  Objective: Vital signs in last 24 hours: Temp:  [98 F (36.7 C)-98.2 F (36.8 C)] 98 F (36.7 C) (02/26 0740) Pulse Rate:  [74-90] 86 (02/26 0740) Resp:  [18] 18 (02/26 0740) BP: (115-134)/(67-76) 126/76 (02/26 0740) SpO2:  [94 %-97 %] 97 % (02/26 0740) Last BM Date: 08/05/16  Intake/Output from previous day: 02/25 0701 - 02/26 0700 In: 1680 [P.O.:1680] Out: 5525 [Urine:5525] Intake/Output this shift: Total I/O In: 500 [P.O.:500] Out: 200 [Urine:200]  Physical exam:  General: Resting in bed Chest: Tender to palpation of bilateral posterior chest right more than left. Heart: Regular rhythm Abdomen: Soft and nontender  Lab Results: CBC   Recent Labs  08/06/16 0908 08/07/16 0341  WBC 14.1* 17.2*  HGB 14.2 13.5  HCT 39.8* 39.6*  PLT 189 198   BMET  Recent Labs  08/05/16 1745  NA 136  K 3.8  CL 104  CO2 24  GLUCOSE 120*  BUN 21*  CREATININE 1.35*  CALCIUM 8.4*   PT/INR No results for input(s): LABPROT, INR in the last 72 hours. ABG No results for input(s): PHART, HCO3 in the last 72 hours.  Invalid input(s): PCO2, PO2  Studies/Results: Dg Chest 1 View  Result Date: 08/05/2016 CLINICAL DATA:  Back pain after fall off a ladder EXAM: CHEST 1 VIEW COMPARISON:  08/05/2016 FINDINGS: Low lung volumes. No acute infiltrate or effusion. Cardiomediastinal silhouette within normal limits. No pneumothorax. IMPRESSION: Low lung volumes. No radiographic evidence for acute cardiopulmonary abnormality. Electronically Signed   By: Donavan Foil M.D.   On: 08/05/2016 22:57   Dg Thoracic Spine 2 View  Result Date: 08/05/2016 CLINICAL DATA:  [place] about 51ft off ladder, landed flat on his back. Mid>lower back pain without leg pain or  tingling.No previous injury to lower back EXAM: THORACIC SPINE 2 VIEWS COMPARISON:  None. FINDINGS: No fracture. No spondylolisthesis. There are endplate osteophytes along the mid to lower thoracic spine. No bone lesion. The soft tissues are unremarkable. IMPRESSION: No fracture, spondylolisthesis or acute finding. Electronically Signed   By: Lajean Manes M.D.   On: 08/05/2016 18:29   Dg Lumbar Spine 2-3 Views  Result Date: 08/05/2016 CLINICAL DATA:  [place] about 68ft off ladder, landed flat on his back. Mid>lower back pain without leg pain or tingling.No previous injury to lower back EXAM: LUMBAR SPINE - 2-3 VIEW COMPARISON:  Lumbar MRI, 08/12/2012 FINDINGS: No acute fracture.  No bone lesion.  No spondylolisthesis. Mild loss disc height at L2-L3 and L3-L4. Moderate loss disc height at L4-L5 and L5-S1. There are minor endplate osteophytes most evident at L5-S1. Facet degenerative changes noted at L5-S1. The soft tissues are unremarkable. IMPRESSION: No fracture or acute finding. Electronically Signed   By: Lajean Manes M.D.   On: 08/05/2016 18:31   Ct Head Wo Contrast  Result Date: 08/05/2016 CLINICAL DATA:  [place] about 4ft off ladder, landed flat on his back. Mid>lower back pain without leg pain or tingling.No previous injury to lower back EXAM: CT HEAD WITHOUT CONTRAST TECHNIQUE: Contiguous axial images were obtained from the base of the skull through the vertex without intravenous contrast. COMPARISON:  None. FINDINGS: Brain: No evidence of acute infarction, hemorrhage, hydrocephalus, extra-axial collection or mass lesion/mass effect. Vascular: No hyperdense vessel or unexpected calcification. Skull:  Normal. Negative for fracture or focal lesion. Sinuses/Orbits: Mild ethmoid and maxillary sinus mucosal thickening. No sinus air-fluid levels. Clear mastoid air cells. Unremarkable globes and orbits. Other: None. IMPRESSION: 1. No intracranial abnormality.  No skull fracture. Electronically Signed   By: Lajean Manes M.D.   On: 08/05/2016 18:33   Ct Chest W Contrast  Result Date: 08/06/2016 CLINICAL DATA:  61 year old male with fall and back pain. EXAM: CT CHEST WITH CONTRAST TECHNIQUE: Multidetector CT imaging of the chest was performed during intravenous contrast administration. CONTRAST:  40mL ISOVUE-300 IOPAMIDOL (ISOVUE-300) INJECTION 61% COMPARISON:  Chest radiograph dated 08/05/2016 and thoracic spine CT dated 08/05/2016 FINDINGS: Cardiovascular: there is no cardiomegaly or pericardial effusion. Mild dilatation of the ascending aorta measuring up to 4.4 cm. There is no aneurysmal dilatation or evidence of dissection. The origins of the great vessels of the aortic arch appear patent. The central pulmonary arteries are grossly unremarkable. Mediastinum/Nodes: There is no hilar or mediastinal adenopathy. The esophagus and the thyroid gland are grossly unremarkable. Lungs/Pleura: There are bibasilar subpleural atelectasis/scarring. The lungs are otherwise clear. There is no pleural effusion or pneumothorax. Mild bilateral lower lobe bronchiectatic changes. The central airways are patent. New Upper Abdomen: No acute abnormality. Musculoskeletal: Mild degenerative changes of the spine. The there is nondisplaced fracture of the left anterior 4-9 ribs. There is also minimally displaced fracture of the right anterior4-8th ribs. IMPRESSION: 1. Multiple bilateral rib fractures. No pneumothorax. No other acute/traumatic intrathoracic pathology. 2. Mild dilatation of the ascending aorta measuring up to 4.4 cm. Ascending thoracic aortic aneurysm. Recommend semi-annual imaging followup by CTA or MRA and referral to cardiothoracic surgery if not already obtained. This recommendation follows 2010 ACCF/AHA/AATS/ACR/ASA/SCA/SCAI/SIR/STS/SVM Guidelines for the Diagnosis and Management of Patients With Thoracic Aortic Disease. Circulation. 2010; [id number] Electronically Signed   By: Anner Crete M.D.   On: 08/06/2016 00:06    Ct Thoracic Spine Wo Contrast  Result Date: 08/05/2016 CLINICAL DATA:  Pt fell from a ladder today landing flat on his back, severe back pain, trouble breathing, unable to get up from supine position, moaning in pain EXAM: CT THORACIC SPINE WITHOUT CONTRAST; CT LUMBAR SPINE WITHOUT CONTRAST TECHNIQUE: Multidetector CT images of the thoracic were obtained using the standard protocol without intravenous contrast. COMPARISON:  Current thoracic and lumbar spine radiographs. Lumbar MRI, 08/12/2012. FINDINGS: Alignment: Normal. Vertebrae: No thoracic fracture. No fracture of the visualized ribs. There are nondisplaced fractures of the left L3 transverse process and the right L4 transverse process. No other lumbar spine fractures. No bone lesions. Paraspinal and other soft tissues: No thoracic disc herniation or significant disc bulging. The thoracic spinal canal is well preserved. No evidence of hematoma. The neural foramina are well preserved. Mild spondylotic disc bulging noted from L2-L3 through L5-S1. Mild facet degenerative changes noted bilaterally at L5-S1. There is neural foraminal narrowing bilaterally at L4-L5 and L5-S1, greatest on the right at L5-S1. No disc herniation. No epidural hematoma. Disc levels: There are small endplate osteophytes throughout most of the thoracic spine. The thoracic disc spaces are relatively well preserved. There is mild loss of disc height at L2-L3 and L3-L4 with mild to moderate loss of disc height at L4-L5 and moderate loss of disc height at L5-S1. No surrounding soft tissue mass or hematoma. There is mild opacity in the visualize lungs that is consistent with atelectasis. No pleural effusion. IMPRESSION: 1. Nondisplaced fractures of the left transverse process of L3 and the right transverse process of L4. 2. No other  fractures.  No other acute abnormalities. Electronically Signed   By: Lajean Manes M.D.   On: 08/05/2016 21:24   Ct Lumbar Spine Wo Contrast  Result Date:  08/05/2016 CLINICAL DATA:  Pt fell from a ladder today landing flat on his back, severe back pain, trouble breathing, unable to get up from supine position, moaning in pain EXAM: CT THORACIC SPINE WITHOUT CONTRAST; CT LUMBAR SPINE WITHOUT CONTRAST TECHNIQUE: Multidetector CT images of the thoracic were obtained using the standard protocol without intravenous contrast. COMPARISON:  Current thoracic and lumbar spine radiographs. Lumbar MRI, 08/12/2012. FINDINGS: Alignment: Normal. Vertebrae: No thoracic fracture. No fracture of the visualized ribs. There are nondisplaced fractures of the left L3 transverse process and the right L4 transverse process. No other lumbar spine fractures. No bone lesions. Paraspinal and other soft tissues: No thoracic disc herniation or significant disc bulging. The thoracic spinal canal is well preserved. No evidence of hematoma. The neural foramina are well preserved. Mild spondylotic disc bulging noted from L2-L3 through L5-S1. Mild facet degenerative changes noted bilaterally at L5-S1. There is neural foraminal narrowing bilaterally at L4-L5 and L5-S1, greatest on the right at L5-S1. No disc herniation. No epidural hematoma. Disc levels: There are small endplate osteophytes throughout most of the thoracic spine. The thoracic disc spaces are relatively well preserved. There is mild loss of disc height at L2-L3 and L3-L4 with mild to moderate loss of disc height at L4-L5 and moderate loss of disc height at L5-S1. No surrounding soft tissue mass or hematoma. There is mild opacity in the visualize lungs that is consistent with atelectasis. No pleural effusion. IMPRESSION: 1. Nondisplaced fractures of the left transverse process of L3 and the right transverse process of L4. 2. No other fractures.  No other acute abnormalities. Electronically Signed   By: Lajean Manes M.D.   On: 08/05/2016 21:24   Dg Chest Port 1 View  Result Date: 08/07/2016 CLINICAL DATA:  Multiple rib fractures EXAM:  PORTABLE CHEST 1 VIEW COMPARISON:  Portable chest x-ray of August 06, 2016. FINDINGS: The lungs are mildly hypoinflated but clear. The costophrenic angles are sharp. No pneumothorax, pneumomediastinum, or pleural effusion is observed. The heart is top-normal in size. The pulmonary vascularity is normal. The mediastinum is normal in width. The bony thorax exhibits no acute abnormality. IMPRESSION: Hypoinflation. Otherwise no acute cardiopulmonary abnormality. The known bilateral rib fractures are not clearly evident on today's chest x-ray. Electronically Signed   By: David  Martinique M.D.   On: 08/07/2016 07:04   Dg Chest Port 1 View  Result Date: 08/06/2016 CLINICAL DATA:  Fall from a ladder yesterday resulted in multiple bilateral rib fractures. EXAM: PORTABLE CHEST 1 VIEW COMPARISON:  Single-view of the chest and CT chest 08/05/2016. FINDINGS: The lungs are clear. No pneumothorax is identified. Heart size is normal. Known rib fractures are better demonstrated on prior CT. IMPRESSION: No acute disease. Negative for pneumothorax. Known rib fractures are better seen on prior CT. Electronically Signed   By: Inge Rise M.D.   On: 08/06/2016 11:10    Anti-infectives: Anti-infectives    None      Assessment/Plan:  61 year old male with bilateral posterior rib fractures. He has been evaluated by physical therapy recommended snf. Needs to continue to work for pain control and respiratory toilet. Discussed with anesthesia who recommended Lidoderm patches prior to placement of a thoracic epidural. Respiratory therapy consult placed today. If pain control and respiratory status improves possible discharge to snf tomorrow.  Satrina Magallanes T. Adonis Huguenin,  MD, [facility] General Surgeon [facility]  Day [facility] [phone] Night [facility] [phone] 08/07/2016

## 2016-08-07 NOTE — Plan of Care (Signed)
Problem: Bowel/Gastric: Goal: Will not experience complications related to bowel motility Outcome: Progressing Pt's plan of care has been discussed with him and family members. Pt is cooperative with care, progressing toward goal of d/c to snf per pt recommendation.

## 2016-08-08 ENCOUNTER — Other Ambulatory Visit: Payer: Self-pay

## 2016-08-08 DIAGNOSIS — S2249XA Multiple fractures of ribs, unspecified side, initial encounter for closed fracture: Secondary | ICD-10-CM

## 2016-08-08 MED ORDER — ALUM & MAG HYDROXIDE-SIMETH 200-200-20 MG/5ML PO SUSP
30.0000 mL | ORAL | Status: DC | PRN
  Administered 2016-08-08 (×2): 30 mL via ORAL
  Filled 2016-08-08 (×2): qty 30

## 2016-08-08 MED ORDER — CYCLOBENZAPRINE HCL 5 MG PO TABS: 5.0000 mg | ORAL_TABLET | Freq: Three times a day (TID) | ORAL | 0 refills | Status: DC | PRN

## 2016-08-08 MED ORDER — OXYCODONE HCL 5 MG PO TABS: 5.0000 mg | ORAL_TABLET | ORAL | 0 refills | Status: DC | PRN

## 2016-08-08 MED ORDER — ONDANSETRON HCL 4 MG PO TABS: 4.0000 mg | ORAL_TABLET | Freq: Four times a day (QID) | ORAL | 0 refills | Status: DC | PRN

## 2016-08-08 MED ORDER — GUAIFENESIN ER 600 MG PO TB12: 600.0000 mg | ORAL_TABLET | Freq: Two times a day (BID) | ORAL | 0 refills | Status: DC | PRN

## 2016-08-08 MED ORDER — ALUM & MAG HYDROXIDE-SIMETH 200-200-20 MG/5ML PO SUSP: 30.0000 mL | ORAL | 0 refills | Status: DC | PRN

## 2016-08-08 NOTE — Progress Notes (Signed)
Patient is medically stable for D/C to [facility] today. Per [facility] authorization has been received and patient can come to room 801. RN will call report to Theressa Stamps RN at [facility]. Patient's sister will transport in private vehicle. Clinical Education officer, museum (CSW) sent D/C orders to [facility] via [facility]. Patient is aware of above and his sister is at bedside and aware of above. Please reconsult if future social work needs arise. CSW signing off.   [employer], LCSW [phone]

## 2016-08-08 NOTE — Discharge Instructions (Signed)
Rib Fracture A rib fracture is a break or crack in one of the bones of the ribs. The ribs are a group of long, curved bones that wrap around your chest and attach to your spine. They protect your lungs and other organs in the chest cavity. A broken or cracked rib is often painful, but most do not cause other problems. Most rib fractures heal on their own over time. However, rib fractures can be more serious if multiple ribs are broken or if broken ribs move out of place and push against other structures. What are the causes?  A direct blow to the chest. For example, this could happen during contact sports, a car accident, or a fall against a hard object.  Repetitive movements with high force, such as pitching a baseball or having severe coughing spells. What are the signs or symptoms?  Pain when you breathe in or cough.  Pain when someone presses on the injured area. How is this diagnosed? Your caregiver will perform a physical exam. Various imaging tests may be ordered to confirm the diagnosis and to look for related injuries. These tests may include a chest X-ray, computed tomography (CT), magnetic resonance imaging (MRI), or a bone scan. How is this treated? Rib fractures usually heal on their own in 1-3 months. The longer healing period is often associated with a continued cough or other aggravating activities. During the healing period, pain control is very important. Medication is usually given to control pain. Hospitalization or surgery may be needed for more severe injuries, such as those in which multiple ribs are broken or the ribs have moved out of place. Follow these instructions at home:  Avoid strenuous activity and any activities or movements that cause pain. Be careful during activities and avoid bumping the injured rib.  Gradually increase activity as directed by your caregiver.  Only take over-the-counter or prescription medications as directed by your caregiver. Do not take  other medications without asking your caregiver first.  Apply ice to the injured area for the first 1-2 days after you have been treated or as directed by your caregiver. Applying ice helps to reduce inflammation and pain.  Put ice in a plastic bag.  Place a towel between your skin and the bag.  Leave the ice on for 15-20 minutes at a time, every 2 hours while you are awake.  Perform deep breathing as directed by your caregiver. This will help prevent pneumonia, which is a common complication of a broken rib. Your caregiver may instruct you to:  Take deep breaths several times a day.  Try to cough several times a day, holding a pillow against the injured area.  Use a device called an incentive spirometer to practice deep breathing several times a day.  Drink enough fluids to keep your urine clear or pale yellow. This will help you avoid constipation.  Do not wear a rib belt or binder. These restrict breathing, which can lead to pneumonia. Get help right away if:  You have a fever.  You have difficulty breathing or shortness of breath.  You develop a continual cough, or you cough up thick or bloody sputum.  You feel sick to your stomach (nausea), throw up (vomit), or have abdominal pain.  You have worsening pain not controlled with medications. This information is not intended to replace advice given to you by your health care provider. Make sure you discuss any questions you have with your health care provider. Document Released: 05/29/2005 Document   Revised: 11/04/2015 Document Reviewed: 07/31/2012 Elsevier Interactive Patient Education  2017 [employer]vier Inc.  

## 2016-08-08 NOTE — Progress Notes (Signed)
PIV removed from l arm by this writer with catheter intact. Pt tolerated well. Family members at bedside have helped pt dress. Pt to be escorted to front entrance of facility and waiting family car for transport to [facility].

## 2016-08-08 NOTE — Clinical Social Work Placement (Signed)
   CLINICAL SOCIAL WORK PLACEMENT  NOTE  Date:  08/08/2016  Patient Details  Name: Arthur Koch MRN: [id number] Date of Birth: [birth year]  Clinical Social Work is seeking post-discharge placement for this patient at the [place] level of care (*CSW will initial, date and re-position this form in  chart as items are completed):  Yes   Patient/family provided with [facility] Work Department's list of facilities offering this level of care within the geographic area requested by the patient (or if unable, by the patient's family).  Yes   Patient/family informed of their freedom to choose among providers that offer the needed level of care, that participate in Medicare, Medicaid or managed care program needed by the patient, have an available bed and are willing to accept the patient.  Yes   Patient/family informed of [facility]'s ownership interest in [facility] and [facility], as well as of the fact that they are under no obligation to receive care at these facilities.  PASRR submitted to EDS on 08/07/16     PASRR number received on 08/07/16     Existing PASRR number confirmed on       FL2 transmitted to all facilities in geographic area requested by pt/family on 08/07/16     FL2 transmitted to all facilities within larger geographic area on       Patient informed that his/her managed care company has contracts with or will negotiate with certain facilities, including the following:        Yes   Patient/family informed of bed offers received.  Patient chooses bed at  ([facility] )     Physician recommends and patient chooses bed at      Patient to be transferred to  ([facility] ) on 08/08/16.  Patient to be transferred to facility by  (Patient's sister will transport in private vehicle. )     Patient family notified on 08/08/16 of transfer.  Name of family member notified:   (Patient's sister is at bedside and aware of D/C today. )      PHYSICIAN       Additional Comment:    _______________________________________________ Ijeoma Loor, [name], LCSW 08/08/2016, 12:25 PM

## 2016-08-08 NOTE — Progress Notes (Signed)
Shift assessment completed. Pt is awake with family member at bedside. Pt in no visible distress, but is declaring that he is not able to eat anything and that he has a lot of gas. This Probation officer reminded pt that he had maalox x2 overnight, pt declaring that this did not help. This writer explained that pain medication being given to pt have side effect of constipation, pt is given prune juice. Pt's lungs are decreased to bilat bases, HR is regular, abdomen is a little distended, firm, bs heard. Pt has urinal in reach, ppp, no edema noted. PIV #20 intact to l fa, site is free of redness and swelling. This Probation officer also explained that walking/movement will also help relieve pt's abdomen. Pt is encouraged to deep breathe and cough. Call bell in reach.

## 2016-08-08 NOTE — Progress Notes (Signed)
Dr. Adonis Huguenin has been in to see pt, d/c orders received. This Probation officer called [facility] and gave report to [employer].

## 2016-08-08 NOTE — Progress Notes (Signed)
Physical Therapy Treatment Patient Details Name: Arthur Koch MRN: [id number] DOB: Aug 15, 1955 Today's Date: 08/08/2016    History of Present Illness 61 yo male with onset of fractures on L L3 and R L4 transverse processes, B multilevel rib fractures,  multiple disc bulges and osteophytes in lumbar spine who fell onto elbows at home, now admitted OBS for manamgemetn of pain and to assess mobility.  PMHx:  HTN, GERD    PT Comments    Pt needs excessive time to get up to sitting at EOB and heavily relies on pulling on trapeze and PT's hand and ultimately is very pain limited.  Pt calling out in pain with any coughing, belching and with even the most minimal mobility.  Overall pt shows great effort and determination to be active but is highly pain limited.   Follow Up Recommendations  SNF     Equipment Recommendations  Rolling walker with 5" wheels    Recommendations for Other Services       Precautions / Restrictions Precautions Precautions: Back;Fall Restrictions Weight Bearing Restrictions: No    Mobility  Bed Mobility Overal bed mobility: Needs Assistance Bed Mobility: Supine to Sit     Supine to sit: Min assist     General bed mobility comments: Pt again very reliant on trapezee and is very slow and guarded.  He has signficant limitations with mobility secondary to severe pain with any movement.  Transfers Overall transfer level: Needs assistance Equipment used: Rolling walker (2 wheeled);1 person hand held assist Transfers: Sit to/from Stand Sit to Stand: Min assist         General transfer comment: Pt needing raised bed height and heavy UE use, but was able to rise (slowly) without excessive assist  Ambulation/Gait Ambulation/Gait assistance: Min guard Ambulation Distance (Feet): 45 Feet Assistive device: Rolling walker (2 wheeled);1 person hand held assist       General Gait Details: Pt showed great effort with ambulation.  He reports still feeling  very painful and guarded but admits to walking much better.  Pt using walker heavily initially, but was able to improve confidence and take a few steps with hallway rail and then even a few with no UE use   Stairs            Wheelchair Mobility    Modified Rankin (Stroke Patients Only)       Balance Overall balance assessment: Modified Independent (with heavy use of UEs initially, then w/o UEs)                                  Cognition Arousal/Alertness: Awake/alert Behavior During Therapy: WFL for tasks assessed/performed Overall Cognitive Status: Within Functional Limits for tasks assessed                      Exercises      General Comments        Pertinent Vitals/Pain Pain Score: 6  Pain Location: 10 with cough/movement in ribs, 6-7 at rest    Home Living                      Prior Function            PT Goals (current goals can now be found in the care plan section) Progress towards PT goals: Progressing toward goals    Frequency    Min 2X/week  PT Plan Current plan remains appropriate    Co-evaluation             End of Session Equipment Utilized During Treatment: Gait belt Activity Tolerance: Patient limited by pain;Patient tolerated treatment well Patient left: in chair;with call bell/phone within reach;with family/visitor present   PT Visit Diagnosis: Pain;Unsteadiness on feet (R26.81) Pain - Right/Left: Left Pain - part of body:  (ribs, back)     Time: [id number] PT Time Calculation (min) (ACUTE ONLY): 28 min  Charges:  $Gait Training: 8-22 mins $Therapeutic Activity: 8-22 mins                    G Codes:       Kreg Shropshire, DPT 08/08/2016, 1:39 PM

## 2016-08-08 NOTE — Discharge Summary (Addendum)
Patient ID: Arthur Koch MRN: [id number] DOB/AGE: 1956-04-14 61 y.o.  Admit date: 08/05/2016 Discharge date: 08/08/2016  Discharge Diagnoses:  Fall from ladder  Procedures Performed: Images and pain control  Discharged Condition: good  Hospital Course: Patient admitted from ER with multiple posterior rib fractures after a fall from a ladder. Had gradual improvement in pain control with oral and IV medications. Was able to be ambulate with PT who recommended skilled nursing facility. On day of discharge he was on room air, able to perform his on transfers with tenderness to palpation to his mid and upper back but otherwise improved. Will discharge to SNF and have him follow up in 2 weeks after a chest x-ray to document further improvement.  Discharge Orders: Discharge Instructions    Diet - low sodium heart healthy    Complete by:  As directed    Increase activity slowly    Complete by:  As directed       Disposition: [place].  Discharge Medications: Allergies as of 08/08/2016   No Known Allergies     Medication List    TAKE these medications   alum & mag hydroxide-simeth 200-200-20 MG/5ML suspension Commonly known as:  MAALOX/MYLANTA Take 30 mLs by mouth every 4 (four) hours as needed for indigestion or heartburn.   aspirin EC 81 MG tablet Take 81 mg by mouth daily.   cyclobenzaprine 5 MG tablet Commonly known as:  FLEXERIL Take 1 tablet (5 mg total) by mouth 3 (three) times daily as needed for muscle spasms.   diazepam 5 MG tablet Commonly known as:  VALIUM Take 1 tablet (5 mg total) by mouth every 8 (eight) hours as needed for muscle spasms.   guaiFENesin 600 MG 12 hr tablet Commonly known as:  MUCINEX Take 1 tablet (600 mg total) by mouth 2 (two) times daily as needed for cough or to loosen phlegm.   ibuprofen 800 MG tablet Commonly known as:  ADVIL,MOTRIN Take 1 tablet (800 mg total) by mouth every 8 (eight) hours as needed.    lisinopril-hydrochlorothiazide 20-12.5 MG tablet Commonly known as:  PRINZIDE,ZESTORETIC Take 1 tablet by mouth daily.   multivitamin tablet Take 1 tablet by mouth daily.   ondansetron 4 MG tablet Commonly known as:  ZOFRAN Take 1 tablet (4 mg total) by mouth every 6 (six) hours as needed for nausea.   oxyCODONE 5 MG immediate release tablet Commonly known as:  Oxy IR/ROXICODONE Take 1-2 tablets (5-10 mg total) by mouth every 4 (four) hours as needed for moderate pain.     pantoprazole 40 MG tablet Commonly known as:  PROTONIX Take 40 mg by mouth daily.        Follwup:  Contact information for follow-up providers    Schedule an appointment as soon as possible for a visit with Pcp Not In System.   Why:  For recheck       Nestor Lewandowsky, MD Follow up in 2 week(s).   Specialty:  Cardiothoracic Surgery Why:  Multiple rib fractures Needs CXR prior to appt Contact information: [place] [address] [phone]            Contact information for after-discharge care    Destination    [facility] Follow up.   Specialty:  [place] information: [address] [place] [phone]                  Signed:  Clayburn Pert 08/08/2016, 12:07 PM

## 2016-08-08 NOTE — Progress Notes (Signed)
Clinical Education officer, museum (CSW) received a call from [employer] stating that Arthur Koch has approved SNF. Patient can D/C to [facility] when stable. Patient is aware of above.   [employer], LCSW [phone]

## 2016-08-08 NOTE — Plan of Care (Signed)
Problem: Bowel/Gastric: Goal: Will not experience complications related to bowel motility Outcome: Completed/Met Date Met: 08/08/16 Pt has met goals for discharge to SNF.

## 2016-08-08 NOTE — NC FL2 (Signed)
[place] LEVEL OF CARE SCREENING TOOL     IDENTIFICATION  Patient Name: Arthur Koch Birthdate: [birth year] Sex: male Admission Date (Current Location): 08/05/2016  [place] and [place] Number:  Engineering geologist and Address:  [facility], [address] [place], [place] [address]      Provider Number: [id number]  Attending Physician Name and Address:  Florene Glen, MD  Relative Name and Phone Number:       Current Level of Care: Hospital Recommended Level of Care: [place] Prior Approval Number:    Date Approved/Denied:   PASRR Number:  ([id number])  Discharge Plan: SNF    Current Diagnoses: Patient Active Problem List   Diagnosis Date Noted  . Rib fractures 08/06/2016  . Multiple closed fractures of ribs of both sides   . Closed fracture of transverse process of lumbar vertebra ([place])   . Fall     Orientation RESPIRATION BLADDER Height & Weight     Self, Time, Situation, Place  Normal Continent Weight: 256 lb 1.6 oz (116.2 kg) Height:  5' 11.5" (181.6 cm)  BEHAVIORAL SYMPTOMS/MOOD NEUROLOGICAL BOWEL NUTRITION STATUS   (none)  (none) Continent Diet (Regular Diet )  AMBULATORY STATUS COMMUNICATION OF NEEDS Skin   Extensive Assist Verbally Normal                       Personal Care Assistance Level of Assistance  Bathing, Feeding, Dressing Bathing Assistance: Limited assistance Feeding assistance: Independent Dressing Assistance: Limited assistance     Functional Limitations Info  Sight, Hearing, Speech Sight Info: Adequate Hearing Info: Adequate Speech Info: Adequate    SPECIAL CARE FACTORS FREQUENCY  PT (By licensed PT), OT (By licensed OT)     PT Frequency:  (5) OT Frequency:  (5)            Contractures      Additional Factors Info  Code Status, Allergies Code Status Info:  (Full Code. ) Allergies Info:  (No Known Allergies. )           Current Medications  (08/08/2016):  This is the current hospital active medication list Current Facility-Administered Medications  Medication Dose Route Frequency Provider Last Rate Last Dose  . 0.9 %  sodium chloride infusion  250 mL Intravenous PRN Florene Glen, MD      . alum & mag hydroxide-simeth (MAALOX/MYLANTA) 200-200-20 MG/5ML suspension 30 mL  30 mL Oral Q4H PRN Lance Coon, MD   30 mL at 08/08/16 0436  . aspirin EC tablet 81 mg  81 mg Oral Daily Florene Glen, MD   81 mg at 08/08/16 0947  . cyclobenzaprine (FLEXERIL) tablet 5 mg  5 mg Oral TID PRN Florene Glen, MD   5 mg at 08/08/16 0531  . guaiFENesin (MUCINEX) 12 hr tablet 600 mg  600 mg Oral BID PRN Christene Lye, MD   600 mg at 08/07/16 0738  . heparin injection 5,000 Units  5,000 Units Subcutaneous Q8H Florene Glen, MD   5,000 Units at 08/08/16 0530  . lisinopril (PRINIVIL,ZESTRIL) tablet 20 mg  20 mg Oral Daily Florene Glen, MD   20 mg at 08/08/16 [id number]   And  . hydrochlorothiazide (MICROZIDE) capsule 12.5 mg  12.5 mg Oral Daily Florene Glen, MD   12.5 mg at 08/08/16 0948  . HYDROmorphone (DILAUDID) injection 1 mg  1 mg Intravenous Q2H PRN Florene Glen, MD  1 mg at 08/08/16 0038  . ibuprofen (ADVIL,MOTRIN) tablet 800 mg  800 mg Oral Q8H PRN Clayburn Pert, MD   800 mg at 08/08/16 0947  . lidocaine (LIDODERM) 5 % 3 patch  3 patch Transdermal Q24H Clayburn Pert, MD   3 patch at 08/07/16 1645  . ondansetron (ZOFRAN) tablet 4 mg  4 mg Oral Q6H PRN Florene Glen, MD       Or  . ondansetron [facility]) injection 4 mg  4 mg Intravenous Q6H PRN Florene Glen, MD      . oxyCODONE (Oxy IR/ROXICODONE) immediate release tablet 5-10 mg  5-10 mg Oral Q4H PRN Clayburn Pert, MD   10 mg at 08/08/16 0436  . pantoprazole (PROTONIX) EC tablet 40 mg  40 mg Oral Daily Florene Glen, MD   40 mg at 08/08/16 0947  . pneumococcal 23 valent vaccine (PNU-IMMUNE) injection 0.5 mL  0.5 mL Intramuscular Tomorrow-1000 Florene Glen,  MD      . sodium chloride flush (NS) 0.9 % injection 3 mL  3 mL Intravenous Q12H Florene Glen, MD   3 mL at 08/07/16 2044  . sodium chloride flush (NS) 0.9 % injection 3 mL  3 mL Intravenous PRN Florene Glen, MD         Discharge Medications: Please see discharge summary for a list of discharge medications.  Relevant Imaging Results:  Relevant Lab Results:   Additional Information  (SSN: [id number])  Sanav Remer, [name], LCSW

## 2016-08-10 ENCOUNTER — Inpatient Hospital Stay
Admission: EM | Admit: 2016-08-10 | Discharge: 2016-08-23 | DRG: 389 | Disposition: A | Discharge status: Home Health | Payer: BLUE CROSS/BLUE SHIELD | Attending: Specialist | Admitting: Specialist

## 2016-08-10 ENCOUNTER — Emergency Department: Payer: BLUE CROSS/BLUE SHIELD

## 2016-08-10 ENCOUNTER — Encounter: Payer: Self-pay | Admitting: Emergency Medicine

## 2016-08-10 DIAGNOSIS — E871 Hypo-osmolality and hyponatremia: Secondary | ICD-10-CM

## 2016-08-10 DIAGNOSIS — Z4659 Encounter for fitting and adjustment of other gastrointestinal appliance and device: Secondary | ICD-10-CM

## 2016-08-10 DIAGNOSIS — T402X5A Adverse effect of other opioids, initial encounter: Secondary | ICD-10-CM | POA: Diagnosis present

## 2016-08-10 DIAGNOSIS — Z87891 Personal history of nicotine dependence: Secondary | ICD-10-CM

## 2016-08-10 DIAGNOSIS — R1084 Generalized abdominal pain: Secondary | ICD-10-CM

## 2016-08-10 DIAGNOSIS — E860 Dehydration: Secondary | ICD-10-CM | POA: Diagnosis present

## 2016-08-10 DIAGNOSIS — E861 Hypovolemia: Secondary | ICD-10-CM | POA: Diagnosis present

## 2016-08-10 DIAGNOSIS — S2249XA Multiple fractures of ribs, unspecified side, initial encounter for closed fracture: Secondary | ICD-10-CM

## 2016-08-10 DIAGNOSIS — S2249XD Multiple fractures of ribs, unspecified side, subsequent encounter for fracture with routine healing: Secondary | ICD-10-CM

## 2016-08-10 DIAGNOSIS — Z7982 Long term (current) use of aspirin: Secondary | ICD-10-CM

## 2016-08-10 DIAGNOSIS — K567 Ileus, unspecified: Principal | ICD-10-CM | POA: Diagnosis present

## 2016-08-10 DIAGNOSIS — Z79899 Other long term (current) drug therapy: Secondary | ICD-10-CM

## 2016-08-10 DIAGNOSIS — D72829 Elevated white blood cell count, unspecified: Secondary | ICD-10-CM | POA: Diagnosis present

## 2016-08-10 DIAGNOSIS — Z79891 Long term (current) use of opiate analgesic: Secondary | ICD-10-CM

## 2016-08-10 DIAGNOSIS — N179 Acute kidney failure, unspecified: Secondary | ICD-10-CM | POA: Diagnosis present

## 2016-08-10 DIAGNOSIS — I10 Essential (primary) hypertension: Secondary | ICD-10-CM | POA: Diagnosis present

## 2016-08-10 DIAGNOSIS — W11XXXD Fall on and from ladder, subsequent encounter: Secondary | ICD-10-CM | POA: Diagnosis present

## 2016-08-10 HISTORY — DX: Multiple fractures of ribs, unspecified side, initial encounter for closed fracture: S22.49XA

## 2016-08-10 LAB — COMPREHENSIVE METABOLIC PANEL
ALBUMIN: 3.5 g/dL (ref 3.5–5.0)
ALT: 30 U/L (ref 17–63)
ANION GAP: 11 (ref 5–15)
AST: 38 U/L (ref 15–41)
Alkaline Phosphatase: 52 U/L (ref 38–126)
BILIRUBIN TOTAL: 2.2 mg/dL — AB (ref 0.3–1.2)
BUN: 65 mg/dL — ABNORMAL HIGH (ref 6–20)
CHLORIDE: 85 mmol/L — AB (ref 101–111)
CO2: 25 mmol/L (ref 22–32)
Calcium: 8.2 mg/dL — ABNORMAL LOW (ref 8.9–10.3)
Creatinine, Ser: 2.52 mg/dL — ABNORMAL HIGH (ref 0.61–1.24)
GFR calc Af Amer: 30 mL/min — ABNORMAL LOW (ref >60)
GFR, EST NON AFRICAN AMERICAN: 26 mL/min — AB (ref >60)
GLUCOSE: 119 mg/dL — AB (ref 65–99)
Potassium: 4.4 mmol/L (ref 3.5–5.1)
Sodium: 121 mmol/L — ABNORMAL LOW (ref 135–145)
TOTAL PROTEIN: 7.2 g/dL (ref 6.5–8.1)

## 2016-08-10 LAB — LIPASE, BLOOD: LIPASE: 22 U/L (ref 11–51)

## 2016-08-10 LAB — CBC
HEMATOCRIT: 37 % — AB (ref 40.0–52.0)
HEMOGLOBIN: 13.3 g/dL (ref 13.0–18.0)
MCH: 30.7 pg (ref 26.0–34.0)
MCHC: 35.9 g/dL (ref 32.0–36.0)
MCV: 85.6 fL (ref 80.0–100.0)
Platelets: 287 10*3/uL (ref 150–440)
RBC: 4.32 MIL/uL — ABNORMAL LOW (ref 4.40–5.90)
RDW: 12.1 % (ref 11.5–14.5)
WBC: 15.2 10*3/uL — AB (ref 3.8–10.6)

## 2016-08-10 MED ORDER — IOPAMIDOL (ISOVUE-300) INJECTION 61%
30.0000 mL | Freq: Once | INTRAVENOUS | Status: AC | PRN
  Administered 2016-08-10: 30 mL via ORAL

## 2016-08-10 MED ORDER — SODIUM CHLORIDE 0.9 % IV BOLUS (SEPSIS)
1000.0000 mL | Freq: Once | INTRAVENOUS | Status: AC
  Administered 2016-08-10: 1000 mL via INTRAVENOUS

## 2016-08-10 NOTE — ED Notes (Signed)
Radiology at bedside with CT contrast instructions.

## 2016-08-10 NOTE — ED Triage Notes (Signed)
Pt presents to ED by EMS from [facility] with c/o generalized abd pressure and constipation since Saturday. Pt also reports having rib pain when coughing. Pt was seen in this ED after falling from a ladder on Saturday and was admitted for several days for observation. Dx with 8 broken ribs. Sent to [facility] for rehab. Pt has not had a bowel movement since saturday and reports abd bloating and pressure the past several days. Pt currently has no increased work of breathing noted.

## 2016-08-10 NOTE — ED Provider Notes (Signed)
[facility] Emergency Department Provider Note    ____________________________________________   I have reviewed the triage vital signs and the nursing notes.   HISTORY  Chief Complaint Abdominal Pain and Constipation   History limited by: Not Limited   HPI Arthur Koch is a 61 y.o. male who presents to the emergency department today from [facility] because of concerns for abdominal pain, distention and possible small bowel obstruction. The patient was just released from the hospital 2 days ago after a fall and posterior rib fractures. He states that he has not had a bowel movement since then. He has had increasing abdominal pain and distention. No vomiting. The patient denies any fevers.   Past Medical History:  Diagnosis Date  . Diverticulitis   . Hypertension     Patient Active Problem List   Diagnosis Date Noted  . Rib fractures 08/06/2016  . Multiple closed fractures of ribs of both sides   . Closed fracture of transverse process of lumbar vertebra ([place])   . Fall     History reviewed. No pertinent surgical history.  Prior to Admission medications   Medication Sig Start Date End Date Taking? Authorizing Provider  alum & mag hydroxide-simeth (MAALOX/MYLANTA) 200-200-20 MG/5ML suspension Take 30 mLs by mouth every 4 (four) hours as needed for indigestion or heartburn. 08/08/16   Clayburn Pert, MD  aspirin EC 81 MG tablet Take 81 mg by mouth daily.    Historical Provider, MD  cyclobenzaprine (FLEXERIL) 5 MG tablet Take 1 tablet (5 mg total) by mouth 3 (three) times daily as needed for muscle spasms. 08/08/16   Clayburn Pert, MD  diazepam (VALIUM) 5 MG tablet Take 1 tablet (5 mg total) by mouth every 8 (eight) hours as needed for muscle spasms. 08/05/16   Earleen Newport, MD  guaiFENesin (MUCINEX) 600 MG 12 hr tablet Take 1 tablet (600 mg total) by mouth 2 (two) times daily as needed for cough or to loosen phlegm. 08/08/16   Clayburn Pert, MD  ibuprofen (ADVIL,MOTRIN) 800 MG tablet Take 1 tablet (800 mg total) by mouth every 8 (eight) hours as needed. 08/05/16   Earleen Newport, MD  lisinopril-hydrochlorothiazide (PRINZIDE,ZESTORETIC) 20-12.5 MG tablet Take 1 tablet by mouth daily. 07/14/16   Historical Provider, MD  Multiple Vitamin (MULTIVITAMIN) tablet Take 1 tablet by mouth daily.    Historical Provider, MD  ondansetron (ZOFRAN) 4 MG tablet Take 1 tablet (4 mg total) by mouth every 6 (six) hours as needed for nausea. 08/08/16   Clayburn Pert, MD  oxyCODONE (OXY IR/ROXICODONE) 5 MG immediate release tablet Take 1-2 tablets (5-10 mg total) by mouth every 4 (four) hours as needed for moderate pain. 08/08/16   Clayburn Pert, MD  oxyCODONE-acetaminophen (PERCOCET) 7.5-325 MG tablet Take 1 tablet by mouth every 4 (four) hours as needed for severe pain. 08/05/16 08/05/17  Earleen Newport, MD  pantoprazole (PROTONIX) 40 MG tablet Take 40 mg by mouth daily. 06/20/16   Historical Provider, MD    Allergies Patient has no known allergies.  No family history on file.  Social History Social History  Substance Use Topics  . Smoking status: Former Research scientist (life sciences)  . Smokeless tobacco: Former Systems developer  . Alcohol use 2.4 oz/week    4 Cans of beer per week     Comment: daily    Review of Systems  Constitutional: Negative for fever. Cardiovascular: Negative for chest pain. Respiratory: Negative for shortness of breath. Gastrointestinal: Positive for abdominal pain, distention. Positive  for nausea. Neurological: Negative for headaches, focal weakness or numbness.  10-point ROS otherwise negative.  ____________________________________________   PHYSICAL EXAM:  VITAL SIGNS: ED Triage Vitals  Enc Vitals Group     BP 08/10/16 2136 109/71     Pulse Rate 08/10/16 2136 89     Resp 08/10/16 2136 20     Temp 08/10/16 2136 97.7 F (36.5 C)     Temp Source 08/10/16 2136 Oral     SpO2 08/10/16 2136 92 %     Weight 08/10/16 2137 256  lb (116.1 kg)     Height 08/10/16 2137 5\' 11"  (1.803 m)     Head Circumference --      [facility] --      Pain Score 08/10/16 2137 5   Constitutional: Alert and oriented. Well appearing and in no distress. Eyes: Conjunctivae are normal. Normal extraocular movements. ENT   Head: Normocephalic and atraumatic.   Nose: No congestion/rhinnorhea.   Mouth/Throat: Mucous membranes are moist.   Neck: No stridor. Hematological/Lymphatic/Immunilogical: No cervical lymphadenopathy. Cardiovascular: Normal rate, regular rhythm.  No murmurs, rubs, or gallops.  Respiratory: Normal respiratory effort without tachypnea nor retractions. Breath sounds are clear and equal bilaterally. No wheezes/rales/rhonchi. Gastrointestinal: Soft and distended. Somewhat diffusely tender to palpation.  Genitourinary: Deferred Musculoskeletal: Normal range of motion in all extremities. No lower extremity edema. Neurologic:  Normal speech and language. No gross focal neurologic deficits are appreciated.  Skin:  Skin is warm, dry and intact. No rash noted. Psychiatric: Mood and affect are normal. Speech and behavior are normal. Patient exhibits appropriate insight and judgment.  ____________________________________________    LABS (pertinent positives/negatives)  Labs Reviewed  COMPREHENSIVE METABOLIC PANEL - Abnormal; Notable for the following:       Result Value   Sodium 121 (*)    Chloride 85 (*)    Glucose, Bld 119 (*)    BUN 65 (*)    Creatinine, Ser 2.52 (*)    Calcium 8.2 (*)    Total Bilirubin 2.2 (*)    GFR calc non Af Amer 26 (*)    GFR calc Af Amer 30 (*)    All other components within normal limits  CBC - Abnormal; Notable for the following:    WBC 15.2 (*)    RBC 4.32 (*)    HCT 37.0 (*)    All other components within normal limits  LIPASE, BLOOD  URINALYSIS, COMPLETE (UACMP) WITH MICROSCOPIC      ____________________________________________   EKG  None  ____________________________________________    RADIOLOGY  CT abd/pel pending  ____________________________________________   PROCEDURES  Procedures  ____________________________________________   INITIAL IMPRESSION / ASSESSMENT AND PLAN / ED COURSE  Pertinent labs & imaging results that were available during my care of the patient were reviewed by me and considered in my medical decision making (see chart for details).  Patient presented to the emergency department today from [facility] because of concerns for abdominal pain and distention and x-ray concerning for obstruction. Exam abdomen is somewhat diffusely tender to palpation and distended. Will obtain a CT scan. No blood work is concerning for hyponatremia as well as acute kidney injury. Certainly regardless of the CT scan findings patient will need to be admitted to the hospital.  ____________________________________________   FINAL CLINICAL IMPRESSION(S) / ED DIAGNOSES  Final diagnoses:  Generalized abdominal pain  Hyponatremia  AKI (acute kidney injury) [facility])     Note: This dictation was prepared with Dragon dictation. Any transcriptional errors that  result from this process are unintentional     Nance Pear, MD 08/10/16 2300

## 2016-08-10 NOTE — ED Notes (Signed)
ED Provider at bedside. 

## 2016-08-11 ENCOUNTER — Inpatient Hospital Stay: Payer: Self-pay | Admitting: Cardiothoracic Surgery

## 2016-08-11 ENCOUNTER — Inpatient Hospital Stay: Payer: BLUE CROSS/BLUE SHIELD

## 2016-08-11 DIAGNOSIS — Z7982 Long term (current) use of aspirin: Secondary | ICD-10-CM | POA: Diagnosis not present

## 2016-08-11 DIAGNOSIS — Z79891 Long term (current) use of opiate analgesic: Secondary | ICD-10-CM | POA: Diagnosis not present

## 2016-08-11 DIAGNOSIS — E871 Hypo-osmolality and hyponatremia: Secondary | ICD-10-CM | POA: Diagnosis not present

## 2016-08-11 DIAGNOSIS — Z79899 Other long term (current) drug therapy: Secondary | ICD-10-CM | POA: Diagnosis not present

## 2016-08-11 DIAGNOSIS — S2249XD Multiple fractures of ribs, unspecified side, subsequent encounter for fracture with routine healing: Secondary | ICD-10-CM | POA: Diagnosis not present

## 2016-08-11 DIAGNOSIS — Z87891 Personal history of nicotine dependence: Secondary | ICD-10-CM | POA: Diagnosis not present

## 2016-08-11 DIAGNOSIS — W11XXXD Fall on and from ladder, subsequent encounter: Secondary | ICD-10-CM | POA: Diagnosis present

## 2016-08-11 DIAGNOSIS — E861 Hypovolemia: Secondary | ICD-10-CM | POA: Diagnosis present

## 2016-08-11 DIAGNOSIS — T402X5A Adverse effect of other opioids, initial encounter: Secondary | ICD-10-CM | POA: Diagnosis present

## 2016-08-11 DIAGNOSIS — D72829 Elevated white blood cell count, unspecified: Secondary | ICD-10-CM | POA: Diagnosis present

## 2016-08-11 DIAGNOSIS — K567 Ileus, unspecified: Secondary | ICD-10-CM | POA: Diagnosis present

## 2016-08-11 DIAGNOSIS — E860 Dehydration: Secondary | ICD-10-CM | POA: Diagnosis present

## 2016-08-11 DIAGNOSIS — I10 Essential (primary) hypertension: Secondary | ICD-10-CM | POA: Diagnosis present

## 2016-08-11 DIAGNOSIS — N179 Acute kidney failure, unspecified: Secondary | ICD-10-CM | POA: Diagnosis present

## 2016-08-11 LAB — COMPREHENSIVE METABOLIC PANEL
ALT: 27 U/L (ref 17–63)
AST: 32 U/L (ref 15–41)
Albumin: 3.2 g/dL — ABNORMAL LOW (ref 3.5–5.0)
Alkaline Phosphatase: 49 U/L (ref 38–126)
Anion gap: 10 (ref 5–15)
BUN: 62 mg/dL — AB (ref 6–20)
CHLORIDE: 88 mmol/L — AB (ref 101–111)
CO2: 23 mmol/L (ref 22–32)
CREATININE: 2.11 mg/dL — AB (ref 0.61–1.24)
Calcium: 8.1 mg/dL — ABNORMAL LOW (ref 8.9–10.3)
GFR, EST AFRICAN AMERICAN: 38 mL/min — AB (ref >60)
GFR, EST NON AFRICAN AMERICAN: 33 mL/min — AB (ref >60)
Glucose, Bld: 114 mg/dL — ABNORMAL HIGH (ref 65–99)
POTASSIUM: 3.9 mmol/L (ref 3.5–5.1)
SODIUM: 121 mmol/L — AB (ref 135–145)
Total Bilirubin: 2.2 mg/dL — ABNORMAL HIGH (ref 0.3–1.2)
Total Protein: 6.5 g/dL (ref 6.5–8.1)

## 2016-08-11 LAB — CBC
HCT: 35 % — ABNORMAL LOW (ref 40.0–52.0)
Hemoglobin: 12.3 g/dL — ABNORMAL LOW (ref 13.0–18.0)
MCH: 30.7 pg (ref 26.0–34.0)
MCHC: 35.2 g/dL (ref 32.0–36.0)
MCV: 87.2 fL (ref 80.0–100.0)
Platelets: 244 10*3/uL (ref 150–440)
RBC: 4.01 MIL/uL — AB (ref 4.40–5.90)
RDW: 12.1 % (ref 11.5–14.5)
WBC: 13.3 10*3/uL — AB (ref 3.8–10.6)

## 2016-08-11 LAB — OSMOLALITY, URINE: OSMOLALITY UR: 414 mosm/kg (ref 300–900)

## 2016-08-11 LAB — URINALYSIS, COMPLETE (UACMP) WITH MICROSCOPIC
Bacteria, UA: NONE SEEN
Bilirubin Urine: NEGATIVE
Glucose, UA: NEGATIVE mg/dL
KETONES UR: NEGATIVE mg/dL
Leukocytes, UA: NEGATIVE
Nitrite: NEGATIVE
PROTEIN: NEGATIVE mg/dL
RBC / HPF: NONE SEEN RBC/hpf (ref 0–5)
Specific Gravity, Urine: 1.012 (ref 1.005–1.030)
pH: 5 (ref 5.0–8.0)

## 2016-08-11 LAB — MAGNESIUM: MAGNESIUM: 2.5 mg/dL — AB (ref 1.7–2.4)

## 2016-08-11 LAB — PHOSPHORUS: Phosphorus: 5.3 mg/dL — ABNORMAL HIGH (ref 2.5–4.6)

## 2016-08-11 MED ORDER — IPRATROPIUM BROMIDE 0.02 % IN SOLN: 0.5000 mg | Freq: Four times a day (QID) | RESPIRATORY_TRACT | Status: DC | PRN

## 2016-08-11 MED ORDER — DIAZEPAM 5 MG PO TABS
5.0000 mg | ORAL_TABLET | Freq: Three times a day (TID) | ORAL | Status: DC | PRN
  Administered 2016-08-11 – 2016-08-12 (×2): 5 mg via ORAL
  Filled 2016-08-11 (×2): qty 1

## 2016-08-11 MED ORDER — LIDOCAINE HCL 2 % EX GEL
CUTANEOUS | Status: AC
  Administered 2016-08-11: 10
  Filled 2016-08-11: qty 10

## 2016-08-11 MED ORDER — ACETAMINOPHEN 650 MG RE SUPP: 650.0000 mg | Freq: Four times a day (QID) | RECTAL | Status: DC | PRN

## 2016-08-11 MED ORDER — ONDANSETRON HCL 4 MG PO TABS: 4.0000 mg | ORAL_TABLET | Freq: Four times a day (QID) | ORAL | Status: DC | PRN

## 2016-08-11 MED ORDER — ACETAMINOPHEN 325 MG PO TABS
650.0000 mg | ORAL_TABLET | Freq: Four times a day (QID) | ORAL | Status: DC | PRN
Start: 2016-08-11 — End: 2016-08-23
  Administered 2016-08-18: 650 mg via ORAL
  Filled 2016-08-11: qty 2

## 2016-08-11 MED ORDER — ZOLPIDEM TARTRATE 5 MG PO TABS
5.0000 mg | ORAL_TABLET | Freq: Every evening | ORAL | Status: DC | PRN
  Administered 2016-08-18 – 2016-08-21 (×3): 5 mg via ORAL
  Filled 2016-08-11 (×3): qty 1

## 2016-08-11 MED ORDER — ASPIRIN EC 81 MG PO TBEC
81.0000 mg | DELAYED_RELEASE_TABLET | Freq: Every day | ORAL | Status: DC
  Administered 2016-08-14 – 2016-08-23 (×9): 81 mg via ORAL
  Filled 2016-08-11 (×10): qty 1

## 2016-08-11 MED ORDER — HEPARIN SODIUM (PORCINE) 5000 UNIT/ML IJ SOLN
5000.0000 [IU] | Freq: Three times a day (TID) | INTRAMUSCULAR | Status: DC
  Administered 2016-08-11 – 2016-08-23 (×35): 5000 [IU] via SUBCUTANEOUS
  Filled 2016-08-11 (×36): qty 1

## 2016-08-11 MED ORDER — OXYCODONE-ACETAMINOPHEN 7.5-325 MG PO TABS: 1.0000 | ORAL_TABLET | ORAL | Status: DC | PRN

## 2016-08-11 MED ORDER — ALBUTEROL SULFATE (2.5 MG/3ML) 0.083% IN NEBU: 2.5000 mg | INHALATION_SOLUTION | Freq: Four times a day (QID) | RESPIRATORY_TRACT | Status: DC | PRN

## 2016-08-11 MED ORDER — PANTOPRAZOLE SODIUM 40 MG PO TBEC: 40.0000 mg | DELAYED_RELEASE_TABLET | Freq: Every day | ORAL | Status: DC

## 2016-08-11 MED ORDER — GUAIFENESIN ER 600 MG PO TB12: 600.0000 mg | ORAL_TABLET | Freq: Two times a day (BID) | ORAL | Status: DC | PRN

## 2016-08-11 MED ORDER — CYCLOBENZAPRINE HCL 10 MG PO TABS
5.0000 mg | ORAL_TABLET | Freq: Three times a day (TID) | ORAL | Status: DC | PRN
  Administered 2016-08-12 – 2016-08-22 (×9): 5 mg via ORAL
  Filled 2016-08-11 (×9): qty 1

## 2016-08-11 MED ORDER — ADULT MULTIVITAMIN W/MINERALS CH
1.0000 | ORAL_TABLET | Freq: Every day | ORAL | Status: DC
  Administered 2016-08-14 – 2016-08-23 (×9): 1 via ORAL
  Filled 2016-08-11 (×9): qty 1

## 2016-08-11 MED ORDER — SODIUM CHLORIDE 0.9 % IV SOLN
INTRAVENOUS | Status: DC
  Administered 2016-08-11 – 2016-08-13 (×6): via INTRAVENOUS
  Administered 2016-08-15: 75 mL/h via INTRAVENOUS
  Administered 2016-08-15: 17:00:00 via INTRAVENOUS
  Administered 2016-08-16: 75 mL/h via INTRAVENOUS
  Administered 2016-08-16 – 2016-08-21 (×8): via INTRAVENOUS

## 2016-08-11 MED ORDER — OXYCODONE HCL 5 MG PO TABS: 5.0000 mg | ORAL_TABLET | ORAL | Status: DC | PRN

## 2016-08-11 MED ORDER — LIDOCAINE 5 % EX PTCH
1.0000 | MEDICATED_PATCH | CUTANEOUS | Status: DC
  Administered 2016-08-11 – 2016-08-22 (×12): 1 via TRANSDERMAL
  Filled 2016-08-11 (×13): qty 1

## 2016-08-11 MED ORDER — MORPHINE SULFATE (PF) 2 MG/ML IV SOLN: 1.0000 mg | INTRAVENOUS | Status: DC | PRN

## 2016-08-11 MED ORDER — ONDANSETRON HCL 4 MG/2ML IJ SOLN
4.0000 mg | Freq: Four times a day (QID) | INTRAMUSCULAR | Status: DC | PRN
  Administered 2016-08-16: 4 mg via INTRAVENOUS
  Filled 2016-08-11: qty 2

## 2016-08-11 NOTE — Progress Notes (Signed)
CC: Pain Subjective: Patient well known to the surgical services as he was just discharged to a skilled nursing facility earlier this week from our service after sustaining a fall. Patient reports no bowel movement since he was sent to the skilled nursing facility with increasing abdominal distention and pain. He reports that since the NG tube was placed and he was admitted to the hospital that he's had systemic relief but states that he is yet to have had any bowel function.  Objective: Vital signs in last 24 hours: Temp:  [97.7 F (36.5 C)-98.2 F (36.8 C)] 98.2 F (36.8 C) (03/02 0750) Pulse Rate:  [77-91] 82 (03/02 0750) Resp:  [12-22] 18 (03/02 0750) BP: (85-127)/(57-85) 102/58 (03/02 0750) SpO2:  [80 %-97 %] 94 % (03/02 0750) Weight:  [116.1 kg (256 lb)] 116.1 kg (256 lb) (03/01 2137) Last BM Date: 08/08/16  Intake/Output from previous day: 03/01 0701 - 03/02 0700 In: 225 [I.V.:225] Out: 300 [Emesis/NG output:300] Intake/Output this shift: No intake/output data recorded.  Physical exam:  Gen.: No acute distress Chest: Clear to auscultation, tender to palpation to the posterior chest at the site of his rib fractures Heart: Regular rhythm Abdomen: Large, mildly tender to palpation in all quadrants, moderately distended. Tympanic in all quadrants.  Lab Results: CBC   Recent Labs  08/10/16 2138 08/11/16 0419  WBC 15.2* 13.3*  HGB 13.3 12.3*  HCT 37.0* 35.0*  PLT 287 244   BMET  Recent Labs  08/10/16 2138 08/11/16 0419  NA 121* 121*  K 4.4 3.9  CL 85* 88*  CO2 25 23  GLUCOSE 119* 114*  BUN 65* 62*  CREATININE 2.52* 2.11*  CALCIUM 8.2* 8.1*   PT/INR No results for input(s): LABPROT, INR in the last 72 hours. ABG No results for input(s): PHART, HCO3 in the last 72 hours.  Invalid input(s): PCO2, PO2  Studies/Results: Ct Abdomen Pelvis Wo Contrast  Result Date: 08/10/2016 CLINICAL DATA:  Abdominal pain and distension. Concern for obstruction or ileus.  Recent hospitalization for fall with posterior rib fractures. EXAM: CT ABDOMEN AND PELVIS WITHOUT CONTRAST TECHNIQUE: Multidetector CT imaging of the abdomen and pelvis was performed following the standard protocol without IV contrast. COMPARISON:  Lumbar spine CT 1 week prior. FINDINGS: Lower chest: Streaky bibasilar atelectasis, confluent in the lower lobes. The heart is normal in size. Trace pleural effusions. Hepatobiliary: Trace perihepatic fluid about the inferior liver tip may be related to right-sided rib fractures. There is no evidence of perihepatic hematoma or hepatic laceration. Gallbladder physiologically distended, no calcified stone. No biliary dilatation. Pancreas: No ductal dilatation or inflammation. Spleen: No splenic injury or perisplenic hematoma. Adrenals/Urinary Tract: No adrenal nodule or hemorrhage. Urinary bladder is distended with prominence of bilateral renal collecting systems and mild hydronephrosis. No urolithiasis. Stomach/Bowel: The stomach is distended with enteric contrast. Enteric contrast in the included esophagus. No significant small bowel dilatation. Gaseous distension of cecum, ascending, transverse and proximal descending colon with liquid stool. Moderate stool in the distal descending and sigmoid colon. Distal colonic diverticulosis without acute inflammation. There is no bowel wall thickening or inflammation. Vascular/Lymphatic: Aortic atherosclerosis without aneurysm. No retroperitoneal fluid. No evidence of adenopathy. Reproductive: Prostate is unremarkable. Other: Fat within the right greater than left inguinal canal. Small fat containing umbilical hernia. No free air or free fluid. Musculoskeletal: Bilateral rib fractures. Transverse process fractures of L4 on the the right and L3 on the left. These are present previously and not significantly changed. Minimally displaced fracture through the left  iliac bone posteriorly. No extension to the sacroiliac joint.  IMPRESSION: 1. Findings consistent with colonic ileus. No evidence of obstruction. Distal colonic diverticulosis without acute inflammation. 2. Enteric contrast throughout the distal esophagus may be secondary to reflux or slow transit. 3. Urinary bladder distention. Dilatation of bilateral renal collecting systems and mild hydronephrosis, likely secondary to bladder distention. 4. Bilateral rib fractures, left L3 and right L4 transverse process fractures as described previously. Fracture of the posterior left iliac bone was not previously described, however is unchanged. 5. Increased atelectasis in both lower lobes. Electronically Signed   By: Jeb Levering M.D.   On: 08/10/2016 23:48   Dg Abd Portable 1 View  Result Date: 08/11/2016 CLINICAL DATA:  NG tube placement. EXAM: PORTABLE ABDOMEN - 1 VIEW COMPARISON:  CT abdomen/ pelvis 3 hours prior FINDINGS: Tip and side port of the enteric tube below the diaphragm in the stomach. Colonic distention is again seen in the upper abdomen. No evidence of free air. IMPRESSION: Tip and side port of the enteric tube below the diaphragm in the stomach. Electronically Signed   By: Jeb Levering M.D.   On: 08/11/2016 02:24    Anti-infectives: Anti-infectives    None      Assessment/Plan:  61 year old male with likely opioid and pain induced colonic ileus versus pseudoobstruction. Discussed with the patient that this is a medication problem and does not usually require surgical intervention. Gastroenterology consult placed for further evaluation and treatment.  Please call if there are any urgent surgical needs.  Leam Madero T. Adonis Huguenin, MD, [facility] General Surgeon [facility]  [facility] [phone] [facility] [phone] 08/11/2016

## 2016-08-11 NOTE — NC FL2 (Signed)
[place] LEVEL OF CARE SCREENING TOOL     IDENTIFICATION  Patient Name: Arthur Koch Birthdate: 10/24/1955 Sex: male Admission Date (Current Location): 08/10/2016  [place] and [place] Number:  Engineering geologist and Address:  [facility], [address], [place], [place] [address]      Provider Number: [id number]  Attending Physician Name and Address:  Henreitta Leber, MD  Relative Name and Phone Number:       Current Level of Care: Hospital Recommended Level of Care: [place] Prior Approval Number:    Date Approved/Denied: 08/11/16 PASRR Number:  ([id number] )  Discharge Plan: SNF     Current Diagnoses: Patient Active Problem List   Diagnosis Date Noted  . Ileus ([place]) 08/11/2016  . Rib fractures 08/06/2016  . Multiple closed fractures of ribs of both sides   . Closed fracture of transverse process of lumbar vertebra ([place])   . Fall     Orientation RESPIRATION BLADDER Height & Weight     Self, Time, Situation, Place  Normal Continent Weight: 256 lb (116.1 kg) Height:  5\' 11"  (180.3 cm)  BEHAVIORAL SYMPTOMS/MOOD NEUROLOGICAL BOWEL NUTRITION STATUS   (none)  (none) Continent Diet, NG/panda (NPO to be advanced )  AMBULATORY STATUS COMMUNICATION OF NEEDS Skin   Limited Assist Verbally Normal                       Personal Care Assistance Level of Assistance  Bathing, Feeding, Dressing Bathing Assistance: Limited assistance Feeding assistance: Independent Dressing Assistance: Limited assistance     Functional Limitations Info  Sight, Hearing, Speech Sight Info: Adequate Hearing Info: Adequate Speech Info: Adequate    SPECIAL CARE FACTORS FREQUENCY  PT (By licensed PT), OT (By licensed OT)     PT Frequency:  (5) OT Frequency:  (5)            Contractures      Additional Factors Info  Code Status, Allergies Code Status Info:  (Full Code. ) Allergies Info:  (No Known Allergies.  )           Current Medications (08/11/2016):  This is the current hospital active medication list Current Facility-Administered Medications  Medication Dose Route Frequency Provider Last Rate Last Dose  . 0.9 %  sodium chloride infusion   Intravenous Continuous Alexis Hugelmeyer, DO 75 mL/hr at 08/11/16 0300    . acetaminophen (TYLENOL) tablet 650 mg  650 mg Oral Q6H PRN Alexis Hugelmeyer, DO       Or  . acetaminophen (TYLENOL) suppository 650 mg  650 mg Rectal Q6H PRN Alexis Hugelmeyer, DO      . albuterol (PROVENTIL) (2.5 MG/3ML) 0.083% nebulizer solution 2.5 mg  2.5 mg Nebulization Q6H PRN Alexis Hugelmeyer, DO      . aspirin EC tablet 81 mg  81 mg Oral Daily Alexis Hugelmeyer, DO      . cyclobenzaprine (FLEXERIL) tablet 5 mg  5 mg Oral TID PRN Alexis Hugelmeyer, DO      . diazepam (VALIUM) tablet 5 mg  5 mg Oral Q8H PRN Alexis Hugelmeyer, DO      . guaiFENesin (MUCINEX) 12 hr tablet 600 mg  600 mg Oral BID PRN Alexis Hugelmeyer, DO      . heparin injection 5,000 Units  5,000 Units Subcutaneous Q8H Alexis Hugelmeyer, DO   5,000 Units at 08/11/16 1504  . ipratropium (ATROVENT) nebulizer solution 0.5 mg  0.5 mg Nebulization Q6H PRN  Alexis Hugelmeyer, DO      . lidocaine (LIDODERM) 5 % 1 patch  1 patch Transdermal Q24H Henreitta Leber, MD   1 patch at 08/11/16 1505  . morphine 2 MG/ML injection 1 mg  1 mg Intravenous Q4H PRN Alexis Hugelmeyer, DO      . multivitamin with minerals tablet 1 tablet  1 tablet Oral Daily Alexis Hugelmeyer, DO      . ondansetron (ZOFRAN) tablet 4 mg  4 mg Oral Q6H PRN Alexis Hugelmeyer, DO       Or  . ondansetron (ZOFRAN) injection 4 mg  4 mg Intravenous Q6H PRN Alexis Hugelmeyer, DO      . oxyCODONE (Oxy IR/ROXICODONE) immediate release tablet 5-10 mg  5-10 mg Oral Q4H PRN Alexis Hugelmeyer, DO      . oxyCODONE-acetaminophen (PERCOCET) 7.5-325 MG per tablet 1 tablet  1 tablet Oral Q4H PRN Alexis Hugelmeyer, DO      . pantoprazole (PROTONIX) EC tablet 40 mg  40  mg Oral QAC breakfast Alexis Hugelmeyer, DO      . zolpidem (AMBIEN) tablet 5 mg  5 mg Oral QHS PRN,MR X 1 Alexis Hugelmeyer, DO         Discharge Medications: Please see discharge summary for a list of discharge medications.  Relevant Imaging Results:  Relevant Lab Results:   Additional Information  (SSN: [id number])  Pami Wool, [name], LCSW

## 2016-08-11 NOTE — Progress Notes (Signed)
[facility] [place] NAME: Arthur Koch    MR#:  [id number]  DATE OF BIRTH:  06-Aug-1955  SUBJECTIVE:   Patient here due to abdominal pain and distention noted to have ileus. Still having significant abdominal pain and distention, family at bedside. Patient is complaining of a cough with productive sputum. No fevers, nausea, vomiting.  REVIEW OF SYSTEMS:    Review of Systems  Constitutional: Negative for chills and fever.  HENT: Negative for congestion and tinnitus.   Eyes: Negative for blurred vision and double vision.  Respiratory: Negative for cough, shortness of breath and wheezing.   Cardiovascular: Negative for chest pain, orthopnea and PND.  Gastrointestinal: Positive for abdominal pain and constipation. Negative for diarrhea, nausea and vomiting.  Genitourinary: Negative for dysuria and hematuria.  Neurological: Negative for dizziness, sensory change and focal weakness.  All other systems reviewed and are negative.   Nutrition: NPO Tolerating Diet: NO Tolerating PT: Await Eval.      DRUG ALLERGIES:  No Known Allergies  VITALS:  Blood pressure (!) 102/58, pulse 82, temperature 98.2 F (36.8 C), temperature source Oral, resp. rate 18, height 5\' 11"  (1.803 m), weight 116.1 kg (256 lb), SpO2 94 %.  PHYSICAL EXAMINATION:   Physical Exam  GENERAL:  61 y.o.-year-old patient lying in the bed in mild GI distress.  EYES: Pupils equal, round, reactive to light and accommodation. No scleral icterus. Extraocular muscles intact.  HEENT: Head atraumatic, normocephalic. Oropharynx and nasopharynx clear.  NECK:  Supple, no jugular venous distention. No thyroid enlargement, no tenderness.  LUNGS: Normal breath sounds bilaterally, no wheezing, rales, rhonchi. No use of accessory muscles of respiration.  CARDIOVASCULAR: S1, S2 normal. No murmurs, rubs, or gallops.  ABDOMEN: Soft, Tender diffusely, distended. Hypoactive Bowel sounds  present. No organomegaly or mass.  EXTREMITIES: No cyanosis, clubbing or edema b/l.    NEUROLOGIC: Cranial nerves II through XII are intact. No focal Motor or sensory deficits b/l.  Globally weak. PSYCHIATRIC: The patient is alert and oriented x 3.  SKIN: No obvious rash, lesion, or ulcer.    LABORATORY PANEL:   CBC  Recent Labs Lab 08/11/16 0419  WBC 13.3*  HGB 12.3*  HCT 35.0*  PLT 244   ------------------------------------------------------------------------------------------------------------------  Chemistries   Recent Labs Lab 08/11/16 0419  NA 121*  K 3.9  CL 88*  CO2 23  GLUCOSE 114*  BUN 62*  CREATININE 2.11*  CALCIUM 8.1*  MG 2.5*  AST 32  ALT 27  ALKPHOS 49  BILITOT 2.2*   ------------------------------------------------------------------------------------------------------------------  Cardiac Enzymes No results for input(s): TROPONINI in the last 168 hours. ------------------------------------------------------------------------------------------------------------------  RADIOLOGY:  Ct Abdomen Pelvis Wo Contrast  Result Date: 08/10/2016 CLINICAL DATA:  Abdominal pain and distension. Concern for obstruction or ileus. Recent hospitalization for fall with posterior rib fractures. EXAM: CT ABDOMEN AND PELVIS WITHOUT CONTRAST TECHNIQUE: Multidetector CT imaging of the abdomen and pelvis was performed following the standard protocol without IV contrast. COMPARISON:  Lumbar spine CT 1 week prior. FINDINGS: Lower chest: Streaky bibasilar atelectasis, confluent in the lower lobes. The heart is normal in size. Trace pleural effusions. Hepatobiliary: Trace perihepatic fluid about the inferior liver tip may be related to right-sided rib fractures. There is no evidence of perihepatic hematoma or hepatic laceration. Gallbladder physiologically distended, no calcified stone. No biliary dilatation. Pancreas: No ductal dilatation or inflammation. Spleen: No splenic injury  or perisplenic hematoma. Adrenals/Urinary Tract: No adrenal nodule or hemorrhage. Urinary bladder is distended with  prominence of bilateral renal collecting systems and mild hydronephrosis. No urolithiasis. Stomach/Bowel: The stomach is distended with enteric contrast. Enteric contrast in the included esophagus. No significant small bowel dilatation. Gaseous distension of cecum, ascending, transverse and proximal descending colon with liquid stool. Moderate stool in the distal descending and sigmoid colon. Distal colonic diverticulosis without acute inflammation. There is no bowel wall thickening or inflammation. Vascular/Lymphatic: Aortic atherosclerosis without aneurysm. No retroperitoneal fluid. No evidence of adenopathy. Reproductive: Prostate is unremarkable. Other: Fat within the right greater than left inguinal canal. Small fat containing umbilical hernia. No free air or free fluid. Musculoskeletal: Bilateral rib fractures. Transverse process fractures of L4 on the the right and L3 on the left. These are present previously and not significantly changed. Minimally displaced fracture through the left iliac bone posteriorly. No extension to the sacroiliac joint. IMPRESSION: 1. Findings consistent with colonic ileus. No evidence of obstruction. Distal colonic diverticulosis without acute inflammation. 2. Enteric contrast throughout the distal esophagus may be secondary to reflux or slow transit. 3. Urinary bladder distention. Dilatation of bilateral renal collecting systems and mild hydronephrosis, likely secondary to bladder distention. 4. Bilateral rib fractures, left L3 and right L4 transverse process fractures as described previously. Fracture of the posterior left iliac bone was not previously described, however is unchanged. 5. Increased atelectasis in both lower lobes. Electronically Signed   By: Jeb Levering M.D.   On: 08/10/2016 23:48   Dg Abd Portable 1 View  Result Date: 08/11/2016 CLINICAL  DATA:  NG tube placement. EXAM: PORTABLE ABDOMEN - 1 VIEW COMPARISON:  CT abdomen/ pelvis 3 hours prior FINDINGS: Tip and side port of the enteric tube below the diaphragm in the stomach. Colonic distention is again seen in the upper abdomen. No evidence of free air. IMPRESSION: Tip and side port of the enteric tube below the diaphragm in the stomach. Electronically Signed   By: Jeb Levering M.D.   On: 08/11/2016 02:24     ASSESSMENT AND PLAN:   61 year old male with past medical history of hypertension, history of diverticulosis who was just recently admitted to the surgical service after a traumatic fall from a ladder and having rib fractures who presents to the hospital due to abdominal pain distention and noted to have an ileus.  1. Ileus-this is secondary to patient's use of narcotics and also poor mobility since his recent fall. -CT scan showing no evidence of bowel obstruction. Continue NG tube decompression, supportive care with IV fluids, antiemetics. We'll get the patient a tap water enema and follow response. Follow serial x-rays. -Surgical service is following and no acute surgical intervention, await gastroenterology input.  2. Hyponatremia-hypovolemic hypotonic in nature secondary to dehydration and use of diuretics.  -We'll hydrate with IV fluids, follow sodium. follow urine and serum osmolality.  3. Acute renal failure-secondary to dehydration. -Continue gentle IV fluid hydration, follow BUN and creatinine and urine output. Hold diuretics and ace inhibitors for now.  4. Status post recent fall and rib fractures-continue pain control as tolerated but avoid narcotics as much as we can given his ileus.  -continue incentive spirometry.  5. Leukocytosis - stress mediated.  - follow WBC count. No need for abx now.    All the records are reviewed and case discussed with Care Management/Social Worker. Management plans discussed with the patient, family and they are in  agreement.  CODE STATUS: Full Code  DVT Prophylaxis: Hep. SQ  TOTAL TIME TAKING CARE OF THIS PATIENT: 30 minutes.   POSSIBLE D/C  IN 2-3 DAYS, DEPENDING ON CLINICAL CONDITION.   Henreitta Leber M.D on 08/11/2016 at 2:53 PM  Between 7am to 6pm - Pager - [phone]  After 6pm go to [email or web address] - Proofreader  [facility]  Office  [phone]  CC: Primary care physician; Pcp Not In System

## 2016-08-11 NOTE — H&P (Addendum)
History and Physical   [facility] [place] @ [facility] Admission History and Physical [employer], D.O.    Patient Name: Arthur Koch MR#: [id number] Date of Birth: [birth year] Date of Admission: 08/10/2016  Referring MD/NP/PA: Dr. Beather Arbour Primary Care Physician: Pcp Not In System  Patient coming from: [facility]  Chief Complaint: Abdominal pain  HPI: Arthur Koch is a 61 y.o. male with a known history of hypertension, diverticulitis presents to the emergency department with the complaint of abdominal pain. Patient was admitted to this facility with multiple posterior rib fractures following a fall from a ladder. He was discharged to skilled nursing facility 2 days ago on 08/08/2016.  Patient says that he has not had a bowel movement since then. He is not passing gas and has not eaten in two days. Enema at his rehab facility did not help. Lying flat improves his discomfort to some degree.  He admits to some difficulty breathing associated with abdominal distention and rib pain.  Patient denies fevers/chills, weakness, dizziness, chest pain, shortness of breath, N/V/C/D,  dysuria/frequency, changes in mental status.   Of note he has been taking NSAIDs for pain which has been mostly well controlled.    Otherwise there has been no change in status. Patient has been taking medication as prescribed and there has been no recent change in medication or diet.  No recent antibiotics.  There has been no recent illness, travel or sick contacts.    ED Course: Patient received normal saline bolus 2. Patient was found to have significant hyponatremia, ileus on CT, acute kidney injury and therefore hospital admission was requested  Review of Systems:  CONSTITUTIONAL: No fever/chills, fatigue, weakness, weight gain/loss, headache. EYES: No blurry or double vision. ENT: No tinnitus, postnasal drip, redness or soreness of the oropharynx. RESPIRATORY: No cough, dyspnea, wheeze.  No  hemoptysis.  CARDIOVASCULAR: No chest pain, palpitations, syncope, orthopnea. No lower extremity edema.  GASTROINTESTINAL: No nausea, vomiting, diarrhea.   No hematemesis, melena or hematochezia.positive abdominal pain, constipation.  GENITOURINARY: No dysuria, frequency, hematuria. ENDOCRINE: No polyuria or nocturia. No heat or cold intolerance. HEMATOLOGY: No anemia, bruising, bleeding. INTEGUMENTARY: No rashes, ulcers, lesions. MUSCULOSKELETAL: No arthritis, gout, dyspnea. Positive back/rib pain.  NEUROLOGIC: No numbness, tingling, ataxia, seizure-type activity, weakness. PSYCHIATRIC: No anxiety, depression, insomnia.   Past Medical History:  Diagnosis Date  . Diverticulitis   . Hypertension     History reviewed. No pertinent surgical history.   reports that he has quit smoking. He has quit using smokeless tobacco. He reports that he drinks about 2.4 oz of alcohol per week . He reports that he does not use drugs.  No Known Allergies  Father died with Parkinsons. Mother is living.  Family history has been reviewed and confirmed with patient.   Prior to Admission medications   Medication Sig Start Date End Date Taking? Authorizing Provider  alum & mag hydroxide-simeth (MAALOX/MYLANTA) 200-200-20 MG/5ML suspension Take 30 mLs by mouth every 4 (four) hours as needed for indigestion or heartburn. 08/08/16   Clayburn Pert, MD  aspirin EC 81 MG tablet Take 81 mg by mouth daily.    Historical Provider, MD  cyclobenzaprine (FLEXERIL) 5 MG tablet Take 1 tablet (5 mg total) by mouth 3 (three) times daily as needed for muscle spasms. 08/08/16   Clayburn Pert, MD  diazepam (VALIUM) 5 MG tablet Take 1 tablet (5 mg total) by mouth every 8 (eight) hours as needed for muscle spasms. 08/05/16   Earleen Newport, MD  guaiFENesin (MUCINEX) 600 MG 12 hr tablet Take 1 tablet (600 mg total) by mouth 2 (two) times daily as needed for cough or to loosen phlegm. 08/08/16   Clayburn Pert, MD   ibuprofen (ADVIL,MOTRIN) 800 MG tablet Take 1 tablet (800 mg total) by mouth every 8 (eight) hours as needed. 08/05/16   Earleen Newport, MD  lisinopril-hydrochlorothiazide (PRINZIDE,ZESTORETIC) 20-12.5 MG tablet Take 1 tablet by mouth daily. 07/14/16   Historical Provider, MD  Multiple Vitamin (MULTIVITAMIN) tablet Take 1 tablet by mouth daily.    Historical Provider, MD  ondansetron (ZOFRAN) 4 MG tablet Take 1 tablet (4 mg total) by mouth every 6 (six) hours as needed for nausea. 08/08/16   Clayburn Pert, MD  oxyCODONE (OXY IR/ROXICODONE) 5 MG immediate release tablet Take 1-2 tablets (5-10 mg total) by mouth every 4 (four) hours as needed for moderate pain. 08/08/16   Clayburn Pert, MD  oxyCODONE-acetaminophen (PERCOCET) 7.5-325 MG tablet Take 1 tablet by mouth every 4 (four) hours as needed for severe pain. 08/05/16 08/05/17  Earleen Newport, MD  pantoprazole (PROTONIX) 40 MG tablet Take 40 mg by mouth daily. 06/20/16   Historical Provider, MD    Physical Exam: Vitals:   08/10/16 2300 08/10/16 2345 08/11/16 0015 08/11/16 0030  BP: 121/85 116/74 127/64 120/64  Pulse: 79 86 91 85  Resp: 14 16 20 16   Temp:      TempSrc:      SpO2: 94% 91% 93% 92%  Weight:      Height:        GENERAL: 61 y.o.-year-old male patient, well-developed, well-nourished lying flat in the bed in no mild distress.  Pleasant and cooperative.   HEENT: Head atraumatic, normocephalic. Pupils equal, round, reactive to light and accommodation. No scleral icterus. Extraocular muscles intact. Nares are patent. Oropharynx is clear. Mucus membranes moist. NECK: Supple, full range of motion. No JVD, no bruit heard. No thyroid enlargement, no tenderness, no cervical lymphadenopathy. CHEST: Normal breath sounds bilaterally. No wheezing, rales, rhonchi or crackles. No use of accessory muscles of respiration.  No reproducible chest wall tenderness.  CARDIOVASCULAR: S1, S2 normal. No murmurs, rubs, or gallops. Cap refill <2  seconds. Pulses intact distally.  ABDOMEN: Soft, significant distention, diffuse tenderness to palpation. No rebound, guarding, rigidity. Decreased bowel sounds in all four quadrants. EXTREMITIES: No pedal edema, cyanosis, or clubbing. No calf tenderness or Homan's sign.  NEUROLOGIC: The patient is alert and oriented x 3. Cranial nerves II through XII are grossly intact with no focal sensorimotor deficit. Muscle strength 5/5 in all extremities. Sensation intact. Gait not checked. PSYCHIATRIC:  Normal affect, mood, thought content. SKIN: Warm, dry, and intact without obvious rash, lesion, or ulcer.    Labs on Admission:  CBC:  Recent Labs Lab 08/05/16 1745 08/06/16 0908 08/07/16 0341 08/10/16 2138  WBC 16.4* 14.1* 17.2* 15.2*  NEUTROABS 13.9*  --   --   --   HGB 14.8 14.2 13.5 13.3  HCT 43.3 39.8* 39.6* 37.0*  MCV 86.9 86.5 88.1 85.6  PLT 193 189 198 [id number]   Basic Metabolic Panel:  Recent Labs Lab 08/05/16 1745 08/10/16 2138  NA 136 121*  K 3.8 4.4  CL 104 85*  CO2 24 25  GLUCOSE 120* 119*  BUN 21* 65*  CREATININE 1.35* 2.52*  CALCIUM 8.4* 8.2*   GFR: Estimated Creatinine Clearance: 40.9 mL/min (by C-G formula based on SCr of 2.52 mg/dL (H)). Liver Function Tests:  Recent Labs Lab 08/05/16 1745 08/10/16 2138  AST 60* 38  ALT 37 30  ALKPHOS 71 52  BILITOT 0.7 2.2*  PROT 7.5 7.2  ALBUMIN 4.0 3.5    Recent Labs Lab 08/10/16 2138  LIPASE 22   No results for input(s): AMMONIA in the last 168 hours. Coagulation Profile: No results for input(s): INR, PROTIME in the last 168 hours. Cardiac Enzymes: No results for input(s): CKTOTAL, CKMB, CKMBINDEX, TROPONINI in the last 168 hours. BNP (last 3 results) No results for input(s): PROBNP in the last 8760 hours. HbA1C: No results for input(s): HGBA1C in the last 72 hours. CBG: No results for input(s): GLUCAP in the last 168 hours. Lipid Profile: No results for input(s): CHOL, HDL, LDLCALC, TRIG, CHOLHDL,  LDLDIRECT in the last 72 hours. Thyroid Function Tests: No results for input(s): TSH, T4TOTAL, FREET4, T3FREE, THYROIDAB in the last 72 hours. Anemia Panel: No results for input(s): VITAMINB12, FOLATE, FERRITIN, TIBC, IRON, RETICCTPCT in the last 72 hours. Urine analysis: No results found for: COLORURINE, APPEARANCEUR, LABSPEC, PHURINE, GLUCOSEU, HGBUR, BILIRUBINUR, KETONESUR, PROTEINUR, UROBILINOGEN, NITRITE, LEUKOCYTESUR Sepsis Labs: @LABRCNTIP (procalcitonin:4,lacticidven:4) )No results found for this or any previous visit (from the past 240 hour(s)).   Radiological Exams on Admission: Ct Abdomen Pelvis Wo Contrast  Result Date: 08/10/2016 CLINICAL DATA:  Abdominal pain and distension. Concern for obstruction or ileus. Recent hospitalization for fall with posterior rib fractures. EXAM: CT ABDOMEN AND PELVIS WITHOUT CONTRAST TECHNIQUE: Multidetector CT imaging of the abdomen and pelvis was performed following the standard protocol without IV contrast. COMPARISON:  Lumbar spine CT 1 week prior. FINDINGS: Lower chest: Streaky bibasilar atelectasis, confluent in the lower lobes. The heart is normal in size. Trace pleural effusions. Hepatobiliary: Trace perihepatic fluid about the inferior liver tip may be related to right-sided rib fractures. There is no evidence of perihepatic hematoma or hepatic laceration. Gallbladder physiologically distended, no calcified stone. No biliary dilatation. Pancreas: No ductal dilatation or inflammation. Spleen: No splenic injury or perisplenic hematoma. Adrenals/Urinary Tract: No adrenal nodule or hemorrhage. Urinary bladder is distended with prominence of bilateral renal collecting systems and mild hydronephrosis. No urolithiasis. Stomach/Bowel: The stomach is distended with enteric contrast. Enteric contrast in the included esophagus. No significant small bowel dilatation. Gaseous distension of cecum, ascending, transverse and proximal descending colon with liquid  stool. Moderate stool in the distal descending and sigmoid colon. Distal colonic diverticulosis without acute inflammation. There is no bowel wall thickening or inflammation. Vascular/Lymphatic: Aortic atherosclerosis without aneurysm. No retroperitoneal fluid. No evidence of adenopathy. Reproductive: Prostate is unremarkable. Other: Fat within the right greater than left inguinal canal. Small fat containing umbilical hernia. No free air or free fluid. Musculoskeletal: Bilateral rib fractures. Transverse process fractures of L4 on the the right and L3 on the left. These are present previously and not significantly changed. Minimally displaced fracture through the left iliac bone posteriorly. No extension to the sacroiliac joint. IMPRESSION: 1. Findings consistent with colonic ileus. No evidence of obstruction. Distal colonic diverticulosis without acute inflammation. 2. Enteric contrast throughout the distal esophagus may be secondary to reflux or slow transit. 3. Urinary bladder distention. Dilatation of bilateral renal collecting systems and mild hydronephrosis, likely secondary to bladder distention. 4. Bilateral rib fractures, left L3 and right L4 transverse process fractures as described previously. Fracture of the posterior left iliac bone was not previously described, however is unchanged. 5. Increased atelectasis in both lower lobes. Electronically Signed   By: Jeb Levering M.D.   On: 08/10/2016 23:48    EKG: Pending  Assessment/Plan: This is a  61 y.o. male with a history of diverticulitis, hypertension now being admitted with:  1. Ileus -Nothing by mouth -NG tube - Pain control -Serial abdominal exams -Surgical consultation requested - Encourage ambulation   2. Hyponatremia, moderate -Admit to inpatient -Gentle IV fluid hydration -Follow BMP -Check urine electrolytes  3. Acute kidney injury -possibly secondary to medications including NSAIDs.  Evidence of bladder distention on  CT.  - IV fluids and repeat BMP in AM.  - Avoid nephrotoxic medications - hold lisinopril/hydrochlorothiazide combo. Hold NSAIDs - Bladder scan and place foley catheter if no/decreased  Urinary output or evidence of urinary retention  4. Recent rib fractures secondary to fall -Continue pain control-continue Flexeril, Valium. Hold NSAIDs -Incentive spirometry    5. History of GERD -Continue Protonix  6.History of HTN - Hold lisinopril/HCTZ - Monitor BP closely  Admission status: Inpatient IV Fluids: Normal saline Diet/Nutrition: Nothing by mouth Consults called: Surgery, PT DVT Px:  Heparin, SCDs early ambulation. Code Status: Full Code  Disposition Plan: To be determined   All the records are reviewed and case discussed with ED provider. Management plans discussed with the patient and/or family who express understanding and agree with plan of care.  Sophina Mitten D.O. on 08/11/2016 at 12:55 AM Between 7am to 6pm - Pager - [phone] After 6pm go to [email or web address] - password [email or web address] [facility] [place] [facility] Office [phone] CC: Primary care physician; Pcp Not In System   08/11/2016, 12:55 AM

## 2016-08-11 NOTE — Clinical Social Work Note (Signed)
Clinical Social Work Assessment  Patient Details  Name: Arthur Koch MRN: [id number] Date of Birth: 12/02/1955  Date of referral:  08/11/16               Reason for consult:  Other (Comment Required) (From [facility] )                Permission sought to share information with:  Chartered certified accountant granted to share information::  Yes, Verbal Permission Granted  Name::        Agency::     Relationship::     Contact Information:     Housing/Transportation Living arrangements for the past 2 months:  Single Family Home Source of Information:  Patient Patient Interpreter Needed:  None Criminal Activity/Legal Involvement Pertinent to Current Situation/Hospitalization:  No - Comment as needed Significant Relationships:  Adult Children, Siblings Lives with:  Self Do you feel safe going back to the place where you live?  Yes Need for family participation in patient care:  Yes (Comment)  Care giving concerns:  Patient is a readmit from [facility] where he was at for 3 days before returning to [facility].    Social Worker assessment / plan:  Holiday representative (CSW) reviewed chart and noted that patient is from [facility] and PT is recommending SNF. Patient walked 150 feet today. Per Arthur Koch [facility] liaison patient can return to [facility] if [employer] approves patient to do so. Per Arthur Koch will likely deny patient based on today's PT note. CSW met with patient and his sister was at bedside. Patient reported that he lives alone and has been at [facility] for 3 days. CSW made patient aware that [facility] will pursue [employer] authorization again but he will likely get denied because his mobility is better then last admission. Per patient he is agreeable to go home if [employer] denies SNF stay. Per Arthur Koch liaison he will start [facility] authorization today. CSW will continue to follow and assist as needed.   Employment status:  Therapist, music:  Managed Care PT Recommendations:  [place] / Referral to community resources:  [place]  Patient/Family's Response to care:  Patient is agreeable to going home if [employer]BS denies SNF stay.   Patient/Family's Understanding of and Emotional Response to Diagnosis, Current Treatment, and Prognosis:  Patient was pleasant and thanked CSW for assistance.   Emotional Assessment Appearance:  Appears stated age Attitude/Demeanor/Rapport:    Affect (typically observed):  Accepting, Adaptable, Pleasant Orientation:  Oriented to Self, Oriented to Place, Oriented to  Time, Oriented to Situation Alcohol / Substance use:  Not Applicable Psych involvement (Current and /or in the community):  No (Comment)  Discharge Needs  Concerns to be addressed:  Discharge Planning Concerns Readmission within the last 30 days:  Yes Current discharge risk:    Barriers to Discharge:  Continued Medical Work up   Arthur Koch, Arthur Beets, LCSW 08/11/2016, 3:13 PM

## 2016-08-11 NOTE — Progress Notes (Signed)
Per [place] has approved SNF. The [facility] is good for 14 days starting tomorrow 08/12/16. Clinical Social Worker (CSW) met with patient and made him aware of above. Per [name] patient can come to [facility] this weekend if stable. CSW will continue to follow and assist as needed.   [employer], LCSW [phone]

## 2016-08-11 NOTE — Evaluation (Signed)
Physical Therapy Evaluation Patient Details Name: Arthur Koch MRN: [id number] DOB: April 17, 1956 Today's Date: 08/11/2016   History of Present Illness  61 yo male who fell off a ladder and suffered multiple rib fx and has been having severe back pain.  He was discharged to rehab 3 days ago and returns with an ileus.   Clinical Impression  Pt with less severe pain than a few days ago before d/c, but is still very guarded and pain limited.  He needed rails and raised HOB to get to sitting but did not report 10/10 pain with the effort as he did before.  Pt was able to ambulate >100 ft w/o AD and though he was tired and struggling with pain he did not have any LOBs or significant safety issues.  Pt's abdomen is very distended and he is uncomfortable with that as well and still feels as though he will need STR, but hoping to improve and be able to go home.      Follow Up Recommendations SNF (hoping to improve enough to go home with HHPT)    Equipment Recommendations       Recommendations for Other Services       Precautions / Restrictions Precautions Precautions: Back;Fall Restrictions Weight Bearing Restrictions: No      Mobility  Bed Mobility Overal bed mobility: Modified Independent Bed Mobility: Supine to Sit     Supine to sit: Min guard     General bed mobility comments: heavily reliant on bed rails and needed HOB elevated to ~45 degrees  Transfers Overall transfer level: Modified independent Equipment used: Rolling walker (2 wheeled);1 person hand held assist Transfers: Sit to/from Stand Sit to Stand: Min guard Stand pivot transfers: Min guard       General transfer comment: Able to rise to standing and maintain balance with walker but pt clearly in pain and uncomfortable with the transition  Ambulation/Gait Ambulation/Gait assistance: Min guard Ambulation Distance (Feet): 150 Feet Assistive device: Rolling walker (2 wheeled);1 person hand held assist        General Gait Details: Pt needing walker/UE use of rails for some of the effort, but was able to do ~100 ft w/o AD and slow, guarded gait.  He had some fatigue (and was unable to take deep breaths) with O2 dropping to high 80s on room air.  Pt had no LOBs but was guarded and lacked confidence.   Stairs            Wheelchair Mobility    Modified Rankin (Stroke Patients Only)       Balance Overall balance assessment: Modified Independent                                           Pertinent Vitals/Pain Pain Assessment: No/denies pain Pain Score: 6  Pain Location: increased to severe pain with coughing, had dry heaves 2/2 NG tube with mobility    Home Living Family/patient expects to be discharged to:: Private residence   Available Help at Discharge: Family                  Prior Function Level of Independence: Independent         Comments: has been working for [employer]        Extremity/Trunk Assessment   Upper Extremity Assessment Upper Extremity Assessment: Generalized  weakness;Overall [facility] for tasks assessed (pain limited secondary to rib fx)    Lower Extremity Assessment Lower Extremity Assessment: Overall WFL for tasks assessed;Generalized weakness (pain limited, but functional t/o)       Communication   Communication: No difficulties  Cognition Arousal/Alertness: Awake/alert Behavior During Therapy: WFL for tasks assessed/performed Overall Cognitive Status: Within Functional Limits for tasks assessed                      General Comments      Exercises     Assessment/Plan    PT Assessment Patient needs continued PT services  PT Problem List Decreased strength;Decreased range of motion;Decreased activity tolerance;Decreased balance;Decreased mobility;Decreased knowledge of use of DME;Decreased safety awareness;Decreased knowledge of precautions;Decreased skin integrity;Pain        PT Treatment Interventions DME instruction;Gait training;Stair training;Functional mobility training;Therapeutic activities;Therapeutic exercise;Balance training;Neuromuscular re-education;Patient/family education    PT Goals (Current goals can be found in the Care Plan section)  Acute Rehab PT Goals Patient Stated Goal: control the pain PT Goal Formulation: With patient/family Time For Goal Achievement: 08/25/16 Potential to Achieve Goals: Fair    Frequency Min 2X/week   Barriers to discharge        Co-evaluation               End of Session Equipment Utilized During Treatment: Gait belt Activity Tolerance: Patient limited by pain;Patient tolerated treatment well Patient left: in chair;with call bell/phone within reach;with family/visitor present Nurse Communication: Mobility status;Patient requests pain meds PT Visit Diagnosis: Pain;Unsteadiness on feet (R26.81) Pain - Right/Left: Left Pain - part of body:  (ribs, back)         Time: [id number] PT Time Calculation (min) (ACUTE ONLY): 22 min   Charges:   PT Evaluation $PT Eval Low Complexity: 1 Procedure     PT G Codes:         Kreg Shropshire , DPT 08/11/2016, 10:37 AM

## 2016-08-12 ENCOUNTER — Inpatient Hospital Stay: Payer: BLUE CROSS/BLUE SHIELD

## 2016-08-12 LAB — CHLORIDE, URINE, RANDOM: Chloride Urine: <15 mmol/L

## 2016-08-12 LAB — SODIUM, URINE, RANDOM: SODIUM UR: 24 mmol/L

## 2016-08-12 LAB — BASIC METABOLIC PANEL
Anion gap: 8 (ref 5–15)
BUN: 40 mg/dL — AB (ref 6–20)
CHLORIDE: 94 mmol/L — AB (ref 101–111)
CO2: 24 mmol/L (ref 22–32)
CREATININE: 1.18 mg/dL (ref 0.61–1.24)
Calcium: 8.3 mg/dL — ABNORMAL LOW (ref 8.9–10.3)
GFR calc Af Amer: >60 mL/min (ref >60)
GFR calc non Af Amer: >60 mL/min (ref >60)
GLUCOSE: 108 mg/dL — AB (ref 65–99)
Potassium: 4.2 mmol/L (ref 3.5–5.1)
SODIUM: 126 mmol/L — AB (ref 135–145)

## 2016-08-12 LAB — CBC
HEMATOCRIT: 36 % — AB (ref 40.0–52.0)
HEMOGLOBIN: 12.5 g/dL — AB (ref 13.0–18.0)
MCH: 30.5 pg (ref 26.0–34.0)
MCHC: 34.8 g/dL (ref 32.0–36.0)
MCV: 87.8 fL (ref 80.0–100.0)
Platelets: 262 10*3/uL (ref 150–440)
RBC: 4.1 MIL/uL — ABNORMAL LOW (ref 4.40–5.90)
RDW: 12.5 % (ref 11.5–14.5)
WBC: 10.8 10*3/uL — ABNORMAL HIGH (ref 3.8–10.6)

## 2016-08-12 LAB — CREATININE, URINE, RANDOM: Creatinine, Urine: 121 mg/dL

## 2016-08-12 NOTE — Progress Notes (Signed)
Patient's NG tube came out. Called Dr. Verdell Carmine who said to put another one in and order a KUB to check for placement.

## 2016-08-12 NOTE — Consult Note (Signed)
Referring Provider: Dr. Verdell Carmine Primary Care Physician:  Pcp Not In System Primary Gastroenterologist:  Althia Forts  Reason for Consultation:  Colonic Ileus  HPI: Arthur Koch is a 61 y.o. male seen for a consult due to a colonic ileus seen on CT scan that has occurred in the setting of a fall from a ladder in late Feb with multiple rib fractures resulting in decreased mobility and use of pain meds (Oxycodone prior to admit). Prior to this admission on 08/10/16 he had not had a BM in several days. Has had abdominal distention without N/V. Prior to his trauma he reports having a BM daily without difficulty except when he had diverticulitis 5 years ago. His sister reports that he had a colonoscopy in 2014 that was normal. A noncontrast CT showed a colonic ileus without obstruction or colonic inflammation. Moderate amount of stool in the distal descending and sigmoid colon. Diverticulosis noted. Low sodium at 121 and Cr 2.52 on admit. NG tube with 500 cc of cloudy dark bile fluid in canister. Enema yesterday and had 3 large loose BMs overnight and this morning per nursing. Continues to have abdominal pain and distention. Sisters at bedside.  Past Medical History:  Diagnosis Date  . Diverticulitis   . Hypertension     History reviewed. No pertinent surgical history.  Prior to Admission medications   Medication Sig Start Date End Date Taking? Authorizing Provider  alum & mag hydroxide-simeth (MAALOX/MYLANTA) 200-200-20 MG/5ML suspension Take 30 mLs by mouth every 4 (four) hours as needed for indigestion or heartburn. 08/08/16  Yes Clayburn Pert, MD  aspirin EC 81 MG tablet Take 81 mg by mouth daily.   Yes Historical Provider, MD  cyclobenzaprine (FLEXERIL) 5 MG tablet Take 1 tablet (5 mg total) by mouth 3 (three) times daily as needed for muscle spasms. 08/08/16  Yes Clayburn Pert, MD  guaiFENesin (MUCINEX) 600 MG 12 hr tablet Take 1 tablet (600 mg total) by mouth 2 (two) times daily as needed for  cough or to loosen phlegm. 08/08/16  Yes Clayburn Pert, MD  lisinopril-hydrochlorothiazide (PRINZIDE,ZESTORETIC) 20-12.5 MG tablet Take 1 tablet by mouth daily. 07/14/16  Yes Historical Provider, MD  Multiple Vitamin (MULTIVITAMIN) tablet Take 1 tablet by mouth daily.   Yes Historical Provider, MD  ondansetron (ZOFRAN) 4 MG tablet Take 1 tablet (4 mg total) by mouth every 6 (six) hours as needed for nausea. 08/08/16  Yes Clayburn Pert, MD  pantoprazole (PROTONIX) 40 MG tablet Take 40 mg by mouth daily. 06/20/16  Yes Historical Provider, MD  diazepam (VALIUM) 5 MG tablet Take 1 tablet (5 mg total) by mouth every 8 (eight) hours as needed for muscle spasms. 08/05/16   Earleen Newport, MD  ibuprofen (ADVIL,MOTRIN) 800 MG tablet Take 1 tablet (800 mg total) by mouth every 8 (eight) hours as needed. 08/05/16   Earleen Newport, MD  oxyCODONE (OXY IR/ROXICODONE) 5 MG immediate release tablet Take 1-2 tablets (5-10 mg total) by mouth every 4 (four) hours as needed for moderate pain. 08/08/16   Clayburn Pert, MD  oxyCODONE-acetaminophen (PERCOCET) 7.5-325 MG tablet Take 1 tablet by mouth every 4 (four) hours as needed for severe pain. 08/05/16 08/05/17  Earleen Newport, MD    Scheduled Meds: . aspirin EC  81 mg Oral Daily  . heparin  5,000 Units Subcutaneous Q8H  . lidocaine  1 patch Transdermal Q24H  . multivitamin with minerals  1 tablet Oral Daily  . pantoprazole  40 mg Oral QAC breakfast  Continuous Infusions: . sodium chloride 75 mL/hr at 08/12/16 0632   PRN Meds:.acetaminophen **OR** acetaminophen, albuterol, cyclobenzaprine, diazepam, guaiFENesin, ipratropium, morphine injection, ondansetron **OR** ondansetron (ZOFRAN) IV, oxyCODONE, oxyCODONE-acetaminophen, zolpidem  Allergies as of 08/10/2016  . (No Known Allergies)    History reviewed. No pertinent family history.  Social History   Social History  . Marital status: Single    Spouse name: N/A  . Number of children: N/A  .  Years of education: N/A   Occupational History  . Not on file.   Social History Main Topics  . Smoking status: Former Research scientist (life sciences)  . Smokeless tobacco: Former Systems developer  . Alcohol use 2.4 oz/week    4 Cans of beer per week     Comment: daily  . Drug use: No  . Sexual activity: Not on file   Other Topics Concern  . Not on file   Social History Narrative  . No narrative on file    Review of Systems: All negative except as stated above in HPI.  Physical Exam: Vital signs: Vitals:   08/12/16 0549 08/12/16 0714  BP: 131/68 (!) 152/82  Pulse: (!) 101 (!) 102  Resp: 18 19  Temp: 99.5 F (37.5 C) 99 F (37.2 C)   Last BM Date: 08/12/16 General:   Alert,  Well-developed, well-nourished, mild acute distress HEENT: anicteric sclera, oropharynx clear, NG tube noted Neck: supple, nontender Lungs:  Clear throughout to auscultation.   No wheezes, crackles, or rhonchi. No acute distress. Heart:  Regular rate and rhythm; no murmurs, clicks, rubs,  or gallops. Abdomen: +distention with diffuse tenderness with mild guarding, decreased bowel sounds  Rectal:  Deferred Ext: no edema  GI:  Lab Results:  Recent Labs  08/10/16 2138 08/11/16 0419 08/12/16 0340  WBC 15.2* 13.3* 10.8*  HGB 13.3 12.3* 12.5*  HCT 37.0* 35.0* 36.0*  PLT 287 244 262   BMET  Recent Labs  08/10/16 2138 08/11/16 0419 08/12/16 0340  NA 121* 121* 126*  K 4.4 3.9 4.2  CL 85* 88* 94*  CO2 25 23 24   GLUCOSE 119* 114* 108*  BUN 65* 62* 40*  CREATININE 2.52* 2.11* 1.18  CALCIUM 8.2* 8.1* 8.3*   LFT  Recent Labs  08/11/16 0419  PROT 6.5  ALBUMIN 3.2*  AST 32  ALT 27  ALKPHOS 49  BILITOT 2.2*   PT/INR No results for input(s): LABPROT, INR in the last 72 hours.   Studies/Results: Ct Abdomen Pelvis Wo Contrast  Result Date: 08/10/2016 CLINICAL DATA:  Abdominal pain and distension. Concern for obstruction or ileus. Recent hospitalization for fall with posterior rib fractures. EXAM: CT ABDOMEN AND  PELVIS WITHOUT CONTRAST TECHNIQUE: Multidetector CT imaging of the abdomen and pelvis was performed following the standard protocol without IV contrast. COMPARISON:  Lumbar spine CT 1 week prior. FINDINGS: Lower chest: Streaky bibasilar atelectasis, confluent in the lower lobes. The heart is normal in size. Trace pleural effusions. Hepatobiliary: Trace perihepatic fluid about the inferior liver tip may be related to right-sided rib fractures. There is no evidence of perihepatic hematoma or hepatic laceration. Gallbladder physiologically distended, no calcified stone. No biliary dilatation. Pancreas: No ductal dilatation or inflammation. Spleen: No splenic injury or perisplenic hematoma. Adrenals/Urinary Tract: No adrenal nodule or hemorrhage. Urinary bladder is distended with prominence of bilateral renal collecting systems and mild hydronephrosis. No urolithiasis. Stomach/Bowel: The stomach is distended with enteric contrast. Enteric contrast in the included esophagus. No significant small bowel dilatation. Gaseous distension of cecum, ascending, transverse and proximal  descending colon with liquid stool. Moderate stool in the distal descending and sigmoid colon. Distal colonic diverticulosis without acute inflammation. There is no bowel wall thickening or inflammation. Vascular/Lymphatic: Aortic atherosclerosis without aneurysm. No retroperitoneal fluid. No evidence of adenopathy. Reproductive: Prostate is unremarkable. Other: Fat within the right greater than left inguinal canal. Small fat containing umbilical hernia. No free air or free fluid. Musculoskeletal: Bilateral rib fractures. Transverse process fractures of L4 on the the right and L3 on the left. These are present previously and not significantly changed. Minimally displaced fracture through the left iliac bone posteriorly. No extension to the sacroiliac joint. IMPRESSION: 1. Findings consistent with colonic ileus. No evidence of obstruction. Distal  colonic diverticulosis without acute inflammation. 2. Enteric contrast throughout the distal esophagus may be secondary to reflux or slow transit. 3. Urinary bladder distention. Dilatation of bilateral renal collecting systems and mild hydronephrosis, likely secondary to bladder distention. 4. Bilateral rib fractures, left L3 and right L4 transverse process fractures as described previously. Fracture of the posterior left iliac bone was not previously described, however is unchanged. 5. Increased atelectasis in both lower lobes. Electronically Signed   By: Jeb Levering M.D.   On: 08/10/2016 23:48   Dg Abd Portable 1 View  Result Date: 08/11/2016 CLINICAL DATA:  NG tube placement. EXAM: PORTABLE ABDOMEN - 1 VIEW COMPARISON:  CT abdomen/ pelvis 3 hours prior FINDINGS: Tip and side port of the enteric tube below the diaphragm in the stomach. Colonic distention is again seen in the upper abdomen. No evidence of free air. IMPRESSION: Tip and side port of the enteric tube below the diaphragm in the stomach. Electronically Signed   By: Jeb Levering M.D.   On: 08/11/2016 02:24    Impression/Plan: Colonic ileus that is improving clinically with NG tube decompression and a tap water enema. Will give 2 additional tap water enemas today. May need a rectal tube tomorrow if diarrhea becomes profuse due to his limited mobility to get to a bedside commode. D/W nursing. I do NOT think a decompressive colonoscopy or neostigmine will be necessary since he is moving his bowels well with the enemas. Will follow closely. Minimize narcotics. Correct electrolytes (keep K in normal range; correct Na as primary team is doing). Follow serial Xrays. Bowel rest. Supportive care. Will follow.    LOS: 1 day   Olivia C.  08/12/2016, 12:17 PM

## 2016-08-12 NOTE — Progress Notes (Signed)
   Patient seen and evaluated this morning. Patient was sleeping soundly and did not arouse during abdominal palpation.  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  Continue to recommend GI evaluation for possible gastric neurological intervention.  No current urgent surgical needs.  Clayburn Pert, MD [facility]  [facility] [phone] [facility] [phone]

## 2016-08-12 NOTE — Progress Notes (Signed)
Pt. Received enema with large liquid stool

## 2016-08-12 NOTE — Progress Notes (Signed)
[facility] [place] NAME: Arthur Koch    MR#:  [id number]  DATE OF BIRTH:  07-05-1955  SUBJECTIVE:   Patient here due to abdominal pain and distention noted to have ileus. Abdominal pain significantly improved since yesterday. Had multiple watery bowel movements after the tap water enema. No other acute events overnight. NG tube still has significant drainage.  REVIEW OF SYSTEMS:    Review of Systems  Constitutional: Negative for chills and fever.  HENT: Negative for congestion and tinnitus.   Eyes: Negative for blurred vision and double vision.  Respiratory: Negative for cough, shortness of breath and wheezing.   Cardiovascular: Negative for chest pain, orthopnea and PND.  Gastrointestinal: Positive for abdominal pain and constipation. Negative for diarrhea, nausea and vomiting.  Genitourinary: Negative for dysuria and hematuria.  Neurological: Negative for dizziness, sensory change and focal weakness.  All other systems reviewed and are negative.   Nutrition: NPO Tolerating Diet: NO Tolerating PT: Eval noted.   DRUG ALLERGIES:  No Known Allergies  VITALS:  Blood pressure (!) 152/82, pulse (!) 102, temperature 99 F (37.2 C), temperature source Oral, resp. rate 19, height 5\' 11"  (1.803 m), weight 116.1 kg (256 lb), SpO2 92 %.  PHYSICAL EXAMINATION:   Physical Exam  GENERAL:  61 y.o.-year-old patient lying in the bed in mild GI distress.  EYES: Pupils equal, round, reactive to light and accommodation. No scleral icterus. Extraocular muscles intact.  HEENT: Head atraumatic, normocephalic. Oropharynx and nasopharynx clear.  NECK:  Supple, no jugular venous distention. No thyroid enlargement, no tenderness.  LUNGS: Normal breath sounds bilaterally, no wheezing, rales, rhonchi. No use of accessory muscles of respiration.  CARDIOVASCULAR: S1, S2 normal. No murmurs, rubs, or gallops.  ABDOMEN: Soft, slightly tender, distended. +  Bowel sounds present. No organomegaly or mass.  EXTREMITIES: No cyanosis, clubbing or edema b/l.    NEUROLOGIC: Cranial nerves II through XII are intact. No focal Motor or sensory deficits b/l.  Globally weak. PSYCHIATRIC: The patient is alert and oriented x 3.  SKIN: No obvious rash, lesion, or ulcer.    LABORATORY PANEL:   CBC  Recent Labs Lab 08/12/16 0340  WBC 10.8*  HGB 12.5*  HCT 36.0*  PLT 262   ------------------------------------------------------------------------------------------------------------------  Chemistries   Recent Labs Lab 08/11/16 0419 08/12/16 0340  NA 121* 126*  K 3.9 4.2  CL 88* 94*  CO2 23 24  GLUCOSE 114* 108*  BUN 62* 40*  CREATININE 2.11* 1.18  CALCIUM 8.1* 8.3*  MG 2.5*  --   AST 32  --   ALT 27  --   ALKPHOS 49  --   BILITOT 2.2*  --    ------------------------------------------------------------------------------------------------------------------  Cardiac Enzymes No results for input(s): TROPONINI in the last 168 hours. ------------------------------------------------------------------------------------------------------------------  RADIOLOGY:  Ct Abdomen Pelvis Wo Contrast  Result Date: 08/10/2016 CLINICAL DATA:  Abdominal pain and distension. Concern for obstruction or ileus. Recent hospitalization for fall with posterior rib fractures. EXAM: CT ABDOMEN AND PELVIS WITHOUT CONTRAST TECHNIQUE: Multidetector CT imaging of the abdomen and pelvis was performed following the standard protocol without IV contrast. COMPARISON:  Lumbar spine CT 1 week prior. FINDINGS: Lower chest: Streaky bibasilar atelectasis, confluent in the lower lobes. The heart is normal in size. Trace pleural effusions. Hepatobiliary: Trace perihepatic fluid about the inferior liver tip may be related to right-sided rib fractures. There is no evidence of perihepatic hematoma or hepatic laceration. Gallbladder physiologically distended, no calcified stone. No  biliary dilatation. Pancreas: No ductal dilatation or inflammation. Spleen: No splenic injury or perisplenic hematoma. Adrenals/Urinary Tract: No adrenal nodule or hemorrhage. Urinary bladder is distended with prominence of bilateral renal collecting systems and mild hydronephrosis. No urolithiasis. Stomach/Bowel: The stomach is distended with enteric contrast. Enteric contrast in the included esophagus. No significant small bowel dilatation. Gaseous distension of cecum, ascending, transverse and proximal descending colon with liquid stool. Moderate stool in the distal descending and sigmoid colon. Distal colonic diverticulosis without acute inflammation. There is no bowel wall thickening or inflammation. Vascular/Lymphatic: Aortic atherosclerosis without aneurysm. No retroperitoneal fluid. No evidence of adenopathy. Reproductive: Prostate is unremarkable. Other: Fat within the right greater than left inguinal canal. Small fat containing umbilical hernia. No free air or free fluid. Musculoskeletal: Bilateral rib fractures. Transverse process fractures of L4 on the the right and L3 on the left. These are present previously and not significantly changed. Minimally displaced fracture through the left iliac bone posteriorly. No extension to the sacroiliac joint. IMPRESSION: 1. Findings consistent with colonic ileus. No evidence of obstruction. Distal colonic diverticulosis without acute inflammation. 2. Enteric contrast throughout the distal esophagus may be secondary to reflux or slow transit. 3. Urinary bladder distention. Dilatation of bilateral renal collecting systems and mild hydronephrosis, likely secondary to bladder distention. 4. Bilateral rib fractures, left L3 and right L4 transverse process fractures as described previously. Fracture of the posterior left iliac bone was not previously described, however is unchanged. 5. Increased atelectasis in both lower lobes. Electronically Signed   By: Jeb Levering  M.D.   On: 08/10/2016 23:48   Dg Abd Portable 1 View  Result Date: 08/11/2016 CLINICAL DATA:  NG tube placement. EXAM: PORTABLE ABDOMEN - 1 VIEW COMPARISON:  CT abdomen/ pelvis 3 hours prior FINDINGS: Tip and side port of the enteric tube below the diaphragm in the stomach. Colonic distention is again seen in the upper abdomen. No evidence of free air. IMPRESSION: Tip and side port of the enteric tube below the diaphragm in the stomach. Electronically Signed   By: Jeb Levering M.D.   On: 08/11/2016 02:24     ASSESSMENT AND PLAN:   61 year old male with past medical history of hypertension, history of diverticulosis who was just recently admitted to the surgical service after a traumatic fall from a ladder and having rib fractures who presents to the hospital due to abdominal pain distention and noted to have an ileus.  1. Ileus-this is secondary to patient's use of narcotics and also poor mobility since his recent fall. -CT scan showing no evidence of bowel obstruction. Continue NG tube decompression, supportive care with IV fluids, antiemetics. Patient got a tap water enema yesterday with multiple watery bowel movements. -Abdomen is still distended but he has some bowel sounds. We'll repeat KUB today. -Surgical service is following and no acute surgical intervention, await gastroenterology input today.    2. Hyponatremia-hypovolemic hypotonic in nature secondary to dehydration and use of diuretics.  -Continue gentle IV fluids and sodium is up to 126 today.  3. Acute renal failure-secondary to dehydration. -Continue gentle IV fluid hydration, BUN/ creatinine improved and normalized now. Continue to hold diuretics for now.  4. Status post recent fall and rib fractures-continue pain control as tolerated but avoid narcotics as much as we can given his ileus.  -continue incentive spirometry.  5. Leukocytosis - stress mediated. Trending down and will monitor.  - No need for abx now.     All the records are reviewed and case  discussed with Care Management/Social Worker. Management plans discussed with the patient, family and they are in agreement.  CODE STATUS: Full Code  DVT Prophylaxis: Hep. SQ  TOTAL TIME TAKING CARE OF THIS PATIENT: 30 minutes.   POSSIBLE D/C IN 2-3 DAYS, DEPENDING ON CLINICAL CONDITION.   Henreitta Leber M.D on 08/12/2016 at 11:51 AM  Between 7am to 6pm - Pager - [phone]  After 6pm go to [email or web address] - Proofreader  [facility] [place] [facility]  Office  [phone]  CC: Primary care physician; Pcp Not In System

## 2016-08-13 ENCOUNTER — Inpatient Hospital Stay: Payer: BLUE CROSS/BLUE SHIELD

## 2016-08-13 LAB — BASIC METABOLIC PANEL
Anion gap: 6 (ref 5–15)
BUN: 30 mg/dL — ABNORMAL HIGH (ref 6–20)
CALCIUM: 8.3 mg/dL — AB (ref 8.9–10.3)
CHLORIDE: 98 mmol/L — AB (ref 101–111)
CO2: 28 mmol/L (ref 22–32)
Creatinine, Ser: 0.78 mg/dL (ref 0.61–1.24)
GFR calc Af Amer: >60 mL/min (ref >60)
Glucose, Bld: 102 mg/dL — ABNORMAL HIGH (ref 65–99)
Potassium: 4.4 mmol/L (ref 3.5–5.1)
SODIUM: 132 mmol/L — AB (ref 135–145)

## 2016-08-13 LAB — UREA NITROGEN, URINE: Urea Nitrogen, Ur: 689 mg/dL

## 2016-08-13 MED ORDER — GUAIFENESIN ER 600 MG PO TB12
600.0000 mg | ORAL_TABLET | Freq: Two times a day (BID) | ORAL | Status: DC
  Administered 2016-08-13 – 2016-08-23 (×20): 600 mg via ORAL
  Filled 2016-08-13 (×20): qty 1

## 2016-08-13 MED ORDER — PANTOPRAZOLE SODIUM 40 MG IV SOLR
40.0000 mg | INTRAVENOUS | Status: DC
  Administered 2016-08-13 – 2016-08-22 (×10): 40 mg via INTRAVENOUS
  Filled 2016-08-13 (×10): qty 40

## 2016-08-13 MED ORDER — DIAZEPAM 5 MG/ML IJ SOLN: 5.0000 mg | Freq: Four times a day (QID) | INTRAMUSCULAR | Status: DC | PRN

## 2016-08-13 MED ORDER — DIAZEPAM 5 MG PO TABS
5.0000 mg | ORAL_TABLET | Freq: Four times a day (QID) | ORAL | Status: DC | PRN
  Administered 2016-08-13 – 2016-08-22 (×15): 5 mg via ORAL
  Filled 2016-08-13 (×15): qty 1

## 2016-08-13 NOTE — Progress Notes (Addendum)
Gastroenterology Progress Note    Arthur Koch 61 y.o. 1956/05/24   Subjective: Moving his bowels. Wants NG tube out. Denies abdominal pain.  Objective: Vital signs in last 24 hours: Vitals:   08/12/16 2336 08/13/16 0741  BP: (!) 147/81 137/79  Pulse: 97 91  Resp: 18 19  Temp: 98.9 F (37.2 C) 98.8 F (37.1 C)    Physical Exam: Gen: lethargic, no acute distress CV: RRR Chest: Coarse breath sounds Abd: less distended, nontender, +BS Ext: no edema  Lab Results:  Recent Labs  08/11/16 0419 08/12/16 0340 08/13/16 0350  NA 121* 126* 132*  K 3.9 4.2 4.4  CL 88* 94* 98*  CO2 23 24 28   GLUCOSE 114* 108* 102*  BUN 62* 40* 30*  CREATININE 2.11* 1.18 0.78  CALCIUM 8.1* 8.3* 8.3*  MG 2.5*  --   --   PHOS 5.3*  --   --     Recent Labs  08/10/16 2138 08/11/16 0419  AST 38 32  ALT 30 27  ALKPHOS 52 49  BILITOT 2.2* 2.2*  PROT 7.2 6.5  ALBUMIN 3.5 3.2*    Recent Labs  08/11/16 0419 08/12/16 0340  WBC 13.3* 10.8*  HGB 12.3* 12.5*  HCT 35.0* 36.0*  MCV 87.2 87.8  PLT 244 262   No results for input(s): LABPROT, INR in the last 72 hours.    Assessment/Plan: Colonic ileus - slowly resolving. Continue enemas prn (if no BMs by 5 pm then repeat enema). Abd Xray reviewed and results pending. D/C NG tube. Since able to get to toilet will hold off on rectal tube. I do not think a decompressive tube is needed since he is moving multiple stools. Sips of water and ice chips ok. Talked with sister. Dr. Vicente Males will f/u tomorrow.   [place] 08/13/2016, 9:35 AM

## 2016-08-13 NOTE — Progress Notes (Signed)
  Patient seen and examined this morning. Strongly desires have his NG tube removed. Denies any current abdominal pain.  No current urgent surgical needs.  Please call for surgical services can be of further assistance with management of this [place], MD [facility]  [place] [phone] [facility] [phone]

## 2016-08-13 NOTE — Progress Notes (Signed)
Physical Therapy Treatment Patient Details Name: Arthur Koch MRN: [id number] DOB: 07-10-1955 Today's Date: 08/13/2016    History of Present Illness 61 yo male who fell off a ladder and suffered multiple rib fx and has been having severe back pain.  He was discharged to rehab 3 days ago and returns with an ileus.     PT Comments    Pt is making gradual progress towards goals this date. Per RN, pt already ambulated 2 laps around RN station using RW this date. Pt is now in too much pain to further ambulate with therapist. Appears to be somewhat self- limiting. With heavy encouragement, pt able to participate in supine there-ex. Pt encouraged to continue therex as tolerated. Will continue to recommend SNF at this time secondary to pain limiting progress.   Follow Up Recommendations  SNF     Equipment Recommendations  Rolling walker with 5" wheels    Recommendations for Other Services       Precautions / Restrictions Precautions Precautions: Back;Fall Precaution Booklet Issued: No Restrictions Weight Bearing Restrictions: No    Mobility  Bed Mobility               General bed mobility comments: refused secondary to pain  Transfers                 General transfer comment: refused secondary to just finishing getting out of bed with RN staff  Ambulation/Gait             General Gait Details: RN in room and reported pt ambulated twice around RN station this date using RW. Pt now refuses further ambulation with therapist   Stairs            Wheelchair Mobility    Modified Rankin (Stroke Patients Only)       Balance                                    Cognition Arousal/Alertness: Awake/alert Behavior During Therapy: WFL for tasks assessed/performed Overall Cognitive Status: Within Functional Limits for tasks assessed                      Exercises Other Exercises Other Exercises: Pt agreeable to ther-ex in bed  this date. Pt performed B LE ankle pumps, quad sets, SAQ, hip abd/add, hip add squeezes, and SLRs. All ther-ex performed x 15 reps with supervision for technique    General Comments        Pertinent Vitals/Pain Pain Assessment: 0-10 Pain Score: 8  Pain Location: low back Pain Descriptors / Indicators: Discomfort Pain Intervention(s): Limited activity within patient's tolerance;RN gave pain meds during session    Home Living                      Prior Function            PT Goals (current goals can now be found in the care plan section) Acute Rehab PT Goals Patient Stated Goal: control the pain PT Goal Formulation: With patient/family Time For Goal Achievement: 08/25/16 Potential to Achieve Goals: Fair Progress towards PT goals: Progressing toward goals    Frequency    Min 2X/week      PT Plan Current plan remains appropriate    Co-evaluation             End of Session Equipment Utilized During Treatment:  Gait belt Activity Tolerance: Patient limited by pain;Patient tolerated treatment well Patient left: in bed;with bed alarm set Nurse Communication: Mobility status PT Visit Diagnosis: Pain;Unsteadiness on feet (R26.81) Pain - Right/Left: Left Pain - part of body:  (back)     Time: [id number] PT Time Calculation (min) (ACUTE ONLY): 14 min  Charges:  $Therapeutic Exercise: 8-22 mins                    G Codes:       Shagun Wordell 08/22/2016, 2:11 PM  Greggory Stallion, PT, DPT [phone]

## 2016-08-13 NOTE — Progress Notes (Signed)
[facility] NAME: Arthur Koch    MR#:  [id number]  DATE OF BIRTH:  07-12-1955  SUBJECTIVE:   Patient here due to abdominal pain and distention noted to have ileus. He had Multiple watery bowel movements since getting the tap water enema yesterday. Abdominal x-ray still showing colonic ileus. Patient wants his NG tube removed.  REVIEW OF SYSTEMS:    Review of Systems  Constitutional: Negative for chills and fever.  HENT: Negative for congestion and tinnitus.   Eyes: Negative for blurred vision and double vision.  Respiratory: Negative for cough, shortness of breath and wheezing.   Cardiovascular: Negative for chest pain, orthopnea and PND.  Gastrointestinal: Positive for abdominal pain and constipation. Negative for diarrhea, nausea and vomiting.  Genitourinary: Negative for dysuria and hematuria.  Neurological: Negative for dizziness, sensory change and focal weakness.  All other systems reviewed and are negative.   Nutrition: NPO except ice chips Tolerating Diet: NO Tolerating PT: Eval noted.   DRUG ALLERGIES:  No Known Allergies  VITALS:  Blood pressure 137/79, pulse 91, temperature 98.8 F (37.1 C), temperature source Oral, resp. rate 19, height 5\' 11"  (1.803 m), weight 116.1 kg (256 lb), SpO2 95 %.  PHYSICAL EXAMINATION:   Physical Exam  GENERAL:  61 y.o.-year-old patient lying in the bed in NAD.  EYES: Pupils equal, round, reactive to light and accommodation. No scleral icterus. Extraocular muscles intact.  HEENT: Head atraumatic, normocephalic. Oropharynx and nasopharynx clear.  NECK:  Supple, no jugular venous distention. No thyroid enlargement, no tenderness.  LUNGS: Normal breath sounds bilaterally, no wheezing, rales, rhonchi. No use of accessory muscles of respiration.  CARDIOVASCULAR: S1, S2 normal. No murmurs, rubs, or gallops.  ABDOMEN: Soft, slightly tender, distended. + Bowel sounds present. No  organomegaly or mass.  EXTREMITIES: No cyanosis, clubbing or edema b/l.    NEUROLOGIC: Cranial nerves II through XII are intact. No focal Motor or sensory deficits b/l.  Globally weak. PSYCHIATRIC: The patient is alert and oriented x 3.  SKIN: No obvious rash, lesion, or ulcer.    LABORATORY PANEL:   CBC  Recent Labs Lab 08/12/16 0340  WBC 10.8*  HGB 12.5*  HCT 36.0*  PLT 262   ------------------------------------------------------------------------------------------------------------------  Chemistries   Recent Labs Lab 08/11/16 0419  08/13/16 0350  NA 121*  < > 132*  K 3.9  < > 4.4  CL 88*  < > 98*  CO2 23  < > 28  GLUCOSE 114*  < > 102*  BUN 62*  < > 30*  CREATININE 2.11*  < > 0.78  CALCIUM 8.1*  < > 8.3*  MG 2.5*  --   --   AST 32  --   --   ALT 27  --   --   ALKPHOS 49  --   --   BILITOT 2.2*  --   --   < > = values in this interval not displayed. ------------------------------------------------------------------------------------------------------------------  Cardiac Enzymes No results for input(s): TROPONINI in the last 168 hours. ------------------------------------------------------------------------------------------------------------------  RADIOLOGY:  Dg Abd 1 View  Result Date: 08/13/2016 CLINICAL DATA:  The ileus. EXAM: ABDOMEN - 1 VIEW COMPARISON:  08/12/2016 FINDINGS: Enteric tube terminates over the proximal stomach, unchanged. Gaseous distension of the colon does not appear significantly changed. Gas is present in nondilated loops of proximal small bowel. There may be mild gaseous distension of some more distal small bowel loops. A small amount of oral contrast material is  seen in the the splenic flexure and rectum. No gross intraperitoneal free air on this supine study. IMPRESSION: Persistent colonic dilatation compatible with ileus. Electronically Signed   By: Logan Bores M.D.   On: 08/13/2016 09:36   Dg Abd 1 View  Result Date:  08/12/2016 CLINICAL DATA:  Encounter for nasogastric tube placement. EXAM: ABDOMEN - 1 VIEW COMPARISON:  Radiographs earlier this day, CT 08/10/2016 FINDINGS: Tip and side port of the enteric tube below the diaphragm in the stomach. Colonic gas just distention is again seen, not significantly changed. No evidence of free air. IMPRESSION: Tip and side port of the enteric tube below the diaphragm in the stomach. Gaseous colonic distention is similar to prior. Electronically Signed   By: Jeb Levering M.D.   On: 08/12/2016 19:58   Dg Abd 1 View  Result Date: 08/12/2016 CLINICAL DATA:  Ileus, NG tube in place. EXAM: ABDOMEN - 1 VIEW COMPARISON:  Abdominal x-ray dated 08/11/2016. CT abdomen dated 08/10/2016. FINDINGS: Nasogastric tube remains appropriately positioned in the stomach. Colonic distention is not significantly changed in the short-term interval. No dilated small bowel loops seen. No evidence of free intraperitoneal air seen. Small pleural effusions and/or atelectasis at each lung base. IMPRESSION: No significant interval change. Nasogastric tube remains appropriately positioned in the stomach. Colonic distention is not significantly changed. Electronically Signed   By: Franki Cabot M.D.   On: 08/12/2016 13:27     ASSESSMENT AND PLAN:   61 year old male with past medical history of hypertension, history of diverticulosis who was just recently admitted to the surgical service after a traumatic fall from a ladder and having rib fractures who presents to the hospital due to abdominal pain distention and noted to have an ileus.  1. Ileus-this is secondary to patient's use of narcotics and also poor mobility since his recent fall. -CT scan showing no evidence of bowel obstruction.  -Patient given tap water enema yesterday and had multiple watery bowel movements. Abdominal pain and distention is improved. KUB this a.m. still showing colonic distention and ileus.  -Appreciate gastroenterology and  surgical input. We'll remove NG tube, continue nothing by mouth except ice chips and meds for now.  2. Hyponatremia-hypovolemic hypotonic in nature secondary to dehydration and use of diuretics.  -Continue gentle IV fluids and sodium improved to 132 today and will cont. To monitor.   3. Acute renal failure-secondary to dehydration. - improved and resolved w/ IV fluid hydration.   4. Status post recent fall and rib fractures-continue pain control as tolerated but avoid narcotics as much as we can given his ileus.  -continue incentive spirometry.  5. Leukocytosis - stress mediated. Trending down and will monitor.  - No need for abx now.    All the records are reviewed and case discussed with Care Management/Social Worker. Management plans discussed with the patient, family and they are in agreement.  CODE STATUS: Full Code  DVT Prophylaxis: Hep. SQ  TOTAL TIME TAKING CARE OF THIS PATIENT: 25 minutes.   POSSIBLE D/C IN 2-3 DAYS, DEPENDING ON CLINICAL CONDITION.   Henreitta Leber M.D on 08/13/2016 at 11:27 AM  Between 7am to 6pm - Pager - [phone]  After 6pm go to [email or web address] - Proofreader  [facility]  Office  [phone]  CC: Primary care physician; Pcp Not In System

## 2016-08-13 NOTE — Progress Notes (Signed)
Nurse had patient up and walking 2 x today. Patient circled the nurses twice on each walk. Patient states that he "feels better". His abdomen is not as taut and the distention has reduced.

## 2016-08-14 DIAGNOSIS — K567 Ileus, unspecified: Principal | ICD-10-CM

## 2016-08-14 LAB — BASIC METABOLIC PANEL
Anion gap: 6 (ref 5–15)
BUN: 25 mg/dL — AB (ref 6–20)
CHLORIDE: 102 mmol/L (ref 101–111)
CO2: 26 mmol/L (ref 22–32)
Calcium: 8.4 mg/dL — ABNORMAL LOW (ref 8.9–10.3)
Creatinine, Ser: 0.97 mg/dL (ref 0.61–1.24)
GFR calc non Af Amer: >60 mL/min (ref >60)
GLUCOSE: 96 mg/dL (ref 65–99)
Potassium: 4.3 mmol/L (ref 3.5–5.1)
Sodium: 134 mmol/L — ABNORMAL LOW (ref 135–145)

## 2016-08-14 LAB — CBC
HEMATOCRIT: 34.4 % — AB (ref 40.0–52.0)
HEMOGLOBIN: 12.4 g/dL — AB (ref 13.0–18.0)
MCH: 31.3 pg (ref 26.0–34.0)
MCHC: 35.9 g/dL (ref 32.0–36.0)
MCV: 87 fL (ref 80.0–100.0)
Platelets: 261 10*3/uL (ref 150–440)
RBC: 3.96 MIL/uL — ABNORMAL LOW (ref 4.40–5.90)
RDW: 12.2 % (ref 11.5–14.5)
WBC: 9.1 10*3/uL (ref 3.8–10.6)

## 2016-08-14 LAB — PHOSPHORUS: PHOSPHORUS: 3 mg/dL (ref 2.5–4.6)

## 2016-08-14 LAB — MAGNESIUM: Magnesium: 1.8 mg/dL (ref 1.7–2.4)

## 2016-08-14 MED ORDER — LISINOPRIL 20 MG PO TABS
20.0000 mg | ORAL_TABLET | Freq: Every day | ORAL | Status: DC
  Administered 2016-08-15 – 2016-08-23 (×8): 20 mg via ORAL
  Filled 2016-08-14 (×7): qty 1

## 2016-08-14 NOTE — Progress Notes (Signed)
This Probation officer noted that patient and/or family deactivating bed alarm, patient up without assistance but wants to return to rehab facility. Education given to pt and family but not received well. Will continue to monitor.

## 2016-08-14 NOTE — Progress Notes (Signed)
Arthur Koch rounding the unit was asked by the Pt's sister to visit with Pt. Pt fell off the ladder and broke some of his ribs. Pt's sisters were bedside. Pt talkative but visibly in pain. Pt's sisters stated Pt was doing much better than today than yesterday. Pt requested prayers for healing, which the [facility] provided. Arthur Koch informed the Pt and his sisters that he was available if needed, Arthur Koch observes that the Pt would benefit from a [facility] follow up visit.    08/14/16 1600  Clinical Encounter Type  Visited With Patient;Patient and family together  Visit Type Initial;Spiritual support  Referral From Family  Consult/Referral To Chaplain  Spiritual Encounters  Spiritual Needs Prayer;Emotional

## 2016-08-14 NOTE — Progress Notes (Signed)
[name] MD [address], [place], [place] [address] Phone: [phone] Fax : [phone]  Arthur Koch is being followed for colonic ileus  Subjective: He says he is better, passing gas , still has abdominal distension.    Objective: Vital signs in last 24 hours: Vitals:   08/13/16 0741 08/13/16 1714 08/13/16 2325 08/14/16 0726  BP: 137/79 (!) 149/83 (!) 143/84 (!) 147/93  Pulse: 91 94 97 (!) 101  Resp: 19   18  Temp: 98.8 F (37.1 C) 97.9 F (36.6 C) 98.9 F (37.2 C) 99.1 F (37.3 C)  TempSrc: Oral Oral Oral Oral  SpO2: 95% 96% 97% 94%  Weight:      Height:       Weight change:   Intake/Output Summary (Last 24 hours) at 08/14/16 0945 Last data filed at 08/14/16 0715  Gross per 24 hour  Intake             3600 ml  Output              150 ml  Net             3450 ml     Exam: Heart:: Regular rate and rhythm, S1S2 present or without murmur or extra heart sounds Lungs: normal, clear to auscultation and clear to auscultation and percussion Abdomen: distended, non tender, tympanic on percussion. BS+   Lab Results: CBC Latest Ref Rng & Units 08/14/2016 08/12/2016 08/11/2016  WBC 3.8 - 10.6 K/uL 9.1 10.8(H) 13.3(H)  Hemoglobin 13.0 - 18.0 g/dL 12.4(L) 12.5(L) 12.3(L)  Hematocrit 40.0 - 52.0 % 34.4(L) 36.0(L) 35.0(L)  Platelets 150 - 440 K/uL 261 262 244    BMP Latest Ref Rng & Units 08/14/2016 08/13/2016 08/12/2016  Glucose 65 - 99 mg/dL 96 102(H) 108(H)  BUN 6 - 20 mg/dL 25(H) 30(H) 40(H)  Creatinine 0.61 - 1.24 mg/dL 0.97 0.78 1.18  Sodium 135 - 145 mmol/L 134(L) 132(L) 126(L)  Potassium 3.5 - 5.1 mmol/L 4.3 4.4 4.2  Chloride 101 - 111 mmol/L 102 98(L) 94(L)  CO2 22 - 32 mmol/L 26 28 24   Calcium 8.9 - 10.3 mg/dL 8.4(L) 8.3(L) 8.3(L)    Micro Results: No results found for this or any previous visit (from the past 240 hour(s)). Studies/Results: Dg Abd 1 View  Result Date: 08/13/2016 CLINICAL DATA:  The ileus. EXAM: ABDOMEN - 1 VIEW COMPARISON:   08/12/2016 FINDINGS: Enteric tube terminates over the proximal stomach, unchanged. Gaseous distension of the colon does not appear significantly changed. Gas is present in nondilated loops of proximal small bowel. There may be mild gaseous distension of some more distal small bowel loops. A small amount of oral contrast material is seen in the the splenic flexure and rectum. No gross intraperitoneal free air on this supine study. IMPRESSION: Persistent colonic dilatation compatible with ileus. Electronically Signed   By: Logan Bores M.D.   On: 08/13/2016 09:36   Dg Abd 1 View  Result Date: 08/12/2016 CLINICAL DATA:  Encounter for nasogastric tube placement. EXAM: ABDOMEN - 1 VIEW COMPARISON:  Radiographs earlier this day, CT 08/10/2016 FINDINGS: Tip and side port of the enteric tube below the diaphragm in the stomach. Colonic gas just distention is again seen, not significantly changed. No evidence of free air. IMPRESSION: Tip and side port of the enteric tube below the diaphragm in the stomach. Gaseous colonic distention is similar to prior. Electronically Signed   By: Jeb Levering M.D.   On: 08/12/2016 19:58   Dg  Abd 1 View  Result Date: 08/12/2016 CLINICAL DATA:  Ileus, NG tube in place. EXAM: ABDOMEN - 1 VIEW COMPARISON:  Abdominal x-ray dated 08/11/2016. CT abdomen dated 08/10/2016. FINDINGS: Nasogastric tube remains appropriately positioned in the stomach. Colonic distention is not significantly changed in the short-term interval. No dilated small bowel loops seen. No evidence of free intraperitoneal air seen. Small pleural effusions and/or atelectasis at each lung base. IMPRESSION: No significant interval change. Nasogastric tube remains appropriately positioned in the stomach. Colonic distention is not significantly changed. Electronically Signed   By: Franki Cabot M.D.   On: 08/12/2016 13:27   Medications: I have reviewed the patient's current medications. Scheduled Meds: . aspirin EC  81 mg  Oral Daily  . guaiFENesin  600 mg Oral BID  . heparin  5,000 Units Subcutaneous Q8H  . lidocaine  1 patch Transdermal Q24H  . multivitamin with minerals  1 tablet Oral Daily  . pantoprazole (PROTONIX) IV  40 mg Intravenous Q24H   Continuous Infusions: . sodium chloride 75 mL/hr at 08/13/16 2228   PRN Meds:.acetaminophen **OR** acetaminophen, albuterol, cyclobenzaprine, diazepam, guaiFENesin, ipratropium, morphine injection, ondansetron **OR** ondansetron (ZOFRAN) IV, oxyCODONE, oxyCODONE-acetaminophen, zolpidem   Assessment: Active Problems:   Ileus ([place])  [place] 61 y.o. male admitted after a traumatic fall from ladder with rib fractures , noted to have an ileus. CT abdomen 08/10/16 shows no luminal obstruction of the colon , no significant small bowel dilation but the entire colon was distended. On admission was in AKI which is resolved. Electrolytes which were low are almost back to normal.   Plan: 1. Continue conservative management with serial abdominal exams 2. Hydration , limit/avoid naroctic use.  3. Rectal tube  4. Clear liquids orally 5. Monitor and replace electrolytes including magnesium > 2 and potassium > 4, check phos 6. If complains of worsening abdominal pain then get X ray abdomen to r/o megacolon vs perforation 7. If no better the next step will be to try Neostigmine    LOS: 3 days   [name] 08/14/2016, 9:45 AM

## 2016-08-14 NOTE — Progress Notes (Signed)
Physical Therapy Treatment Patient Details Name: Arthur Koch MRN: [id number] DOB: 06/25/1955 Today's Date: 08/14/2016    History of Present Illness 61 yo male who fell off a ladder and suffered multiple rib fx and has been having severe back pain.  He was discharged to rehab 3 days ago and returns with an ileus.     PT Comments    Pt demonstrates excellent progress with therapist on this date. He moves slowly with bed mobility and transfers secondary to pain but possess adequate strength to perform without assistance. He is independent with ambulation without an assistive device for 2 laps around RN station. He stays near the wall for security initially but is able to easily progress away from wall. Pt denies DOE and VSS throughout entire distance. Discharge recommendation updated to [facility]. Pt will benefit from skilled PT services to address deficits in strength, balance, and mobility in order to return to full function at home.     Follow Up Recommendations  Home health PT     Equipment Recommendations  None recommended by PT    Recommendations for Other Services       Precautions / Restrictions Precautions Precautions: Back;Fall Precaution Booklet Issued: No Precaution Comments: Rectal tube Restrictions Weight Bearing Restrictions: No    Mobility  Bed Mobility Overal bed mobility: Modified Independent Bed Mobility: Sit to Supine       Sit to supine: Min assist   General bed mobility comments: Received upright at EOB. When returnign to bed requires LE assist secondary to pain  Transfers Overall transfer level: Independent Equipment used: Rolling walker (2 wheeled);1 person hand held assist Transfers: Sit to/from Stand Sit to Stand: Supervision Stand pivot transfers: Supervision       General transfer comment: Pt demonstrates good strength/stability with transfers. Moves slowly secondary to pain  Ambulation/Gait Ambulation/Gait assistance:  Supervision Ambulation Distance (Feet): 350 Feet Assistive device: None Gait Pattern/deviations: Step-through pattern;Step-to pattern;Wide base of support;Trunk flexed Gait velocity: WFL for limited community mobility   General Gait Details: Pt ambulates 2 laps around RN station with therapist. Able to perform horizontal head turns for conversation as well as adjust gait speed. VSS throughout ambulation and pt denies DOE   Financial trader Rankin (Stroke Patients Only)       Balance Overall balance assessment: Modified Independent                                  Cognition Arousal/Alertness: Awake/alert Behavior During Therapy: WFL for tasks assessed/performed Overall Cognitive Status: Within Functional Limits for tasks assessed                      Exercises      General Comments        Pertinent Vitals/Pain Pain Assessment: 0-10 Pain Score: 5  Pain Location: low back Pain Descriptors / Indicators: Discomfort Pain Intervention(s): Monitored during session    Home Living                      Prior Function            PT Goals (current goals can now be found in the care plan section) Acute Rehab PT Goals Patient Stated Goal: control the pain PT Goal Formulation: With patient/family Time For Goal Achievement: 08/25/16 Potential to  Achieve Goals: Fair Progress towards PT goals: Progressing toward goals    Frequency    Min 2X/week      PT Plan Discharge plan needs to be updated    Co-evaluation             End of Session   Activity Tolerance: Patient tolerated treatment well Patient left: in bed;with call bell/phone within reach Nurse Communication: Mobility status PT Visit Diagnosis: Pain;Unsteadiness on feet (R26.81) Pain - Right/Left: Left Pain - part of body:  (back)     Time: [id number] PT Time Calculation (min) (ACUTE ONLY): 20 min  Charges:  $Gait Training:  8-22 mins                    G Codes:      Arthur Koch PT, DPT   Arthur Koch 08/14/2016, 5:32 PM

## 2016-08-14 NOTE — Progress Notes (Addendum)
[facility] [place] NAME: Arthur Koch    MR#:  [id number]  DATE OF BIRTH:  04-Apr-1956  SUBJECTIVE:   Patient still with Distended abdomen  REVIEW OF SYSTEMS:    Review of Systems  Constitutional: Negative for chills and fever.  HENT: Negative for congestion and tinnitus.   Eyes: Negative for blurred vision and double vision.  Respiratory: Negative for cough, shortness of breath and wheezing.   Cardiovascular: Negative for chest pain, orthopnea and PND.  Gastrointestinal: Positive for abdominal pain and constipation. Negative for diarrhea, nausea and vomiting.  Genitourinary: Negative for dysuria and hematuria.  Musculoskeletal: Positive for back pain.  Neurological: Negative for dizziness, sensory change and focal weakness.  All other systems reviewed and are negative.     DRUG ALLERGIES:  No Known Allergies  VITALS:  Blood pressure (!) 147/93, pulse (!) 101, temperature 99.1 F (37.3 C), temperature source Oral, resp. rate 18, height 5\' 11"  (1.803 m), weight 116.1 kg (256 lb), SpO2 94 %.  PHYSICAL EXAMINATION:   Physical Exam  GENERAL:  61 y.o.-year-old patient lying in the bed in NAD.  EYES: Pupils equal, round, reactive to light and accommodation. No scleral icterus. Extraocular muscles intact.  HEENT: Head atraumatic, normocephalic. Oropharynx and nasopharynx clear.  NECK:  Supple, no jugular venous distention. No thyroid enlargement, no tenderness.  LUNGS: Normal breath sounds bilaterally, no wheezing, rales, rhonchi. No use of accessory muscles of respiration.  CARDIOVASCULAR: S1, S2 normal. No murmurs, rubs, or gallops.  ABDOMEN: Decreased bowel sounds with distended abdomen hard to appreciate organomegaly or mass.  EXTREMITIES: No cyanosis, clubbing or edema b/l.    NEUROLOGIC: Cranial nerves II through XII are intact.   Globally weak. PSYCHIATRIC: The patient is alert and oriented x 3.  SKIN: Extensive bruising  lower back   LABORATORY PANEL:   CBC  Recent Labs Lab 08/14/16 0403  WBC 9.1  HGB 12.4*  HCT 34.4*  PLT 261   ------------------------------------------------------------------------------------------------------------------  Chemistries   Recent Labs Lab 08/11/16 0419  08/14/16 0403  NA 121*  < > 134*  K 3.9  < > 4.3  CL 88*  < > 102  CO2 23  < > 26  GLUCOSE 114*  < > 96  BUN 62*  < > 25*  CREATININE 2.11*  < > 0.97  CALCIUM 8.1*  < > 8.4*  MG 2.5*  --   --   AST 32  --   --   ALT 27  --   --   ALKPHOS 49  --   --   BILITOT 2.2*  --   --   < > = values in this interval not displayed. ------------------------------------------------------------------------------------------------------------------  Cardiac Enzymes No results for input(s): TROPONINI in the last 168 hours. ------------------------------------------------------------------------------------------------------------------  RADIOLOGY:  Dg Abd 1 View  Result Date: 08/13/2016 CLINICAL DATA:  The ileus. EXAM: ABDOMEN - 1 VIEW COMPARISON:  08/12/2016 FINDINGS: Enteric tube terminates over the proximal stomach, unchanged. Gaseous distension of the colon does not appear significantly changed. Gas is present in nondilated loops of proximal small bowel. There may be mild gaseous distension of some more distal small bowel loops. A small amount of oral contrast material is seen in the the splenic flexure and rectum. No gross intraperitoneal free air on this supine study. IMPRESSION: Persistent colonic dilatation compatible with ileus. Electronically Signed   By: Logan Bores M.D.   On: 08/13/2016 09:36   Dg Abd 1 View  Result Date: 08/12/2016 CLINICAL DATA:  Encounter for nasogastric tube placement. EXAM: ABDOMEN - 1 VIEW COMPARISON:  Radiographs earlier this day, CT 08/10/2016 FINDINGS: Tip and side port of the enteric tube below the diaphragm in the stomach. Colonic gas just distention is again seen, not  significantly changed. No evidence of free air. IMPRESSION: Tip and side port of the enteric tube below the diaphragm in the stomach. Gaseous colonic distention is similar to prior. Electronically Signed   By: Jeb Levering M.D.   On: 08/12/2016 19:58   Dg Abd 1 View  Result Date: 08/12/2016 CLINICAL DATA:  Ileus, NG tube in place. EXAM: ABDOMEN - 1 VIEW COMPARISON:  Abdominal x-ray dated 08/11/2016. CT abdomen dated 08/10/2016. FINDINGS: Nasogastric tube remains appropriately positioned in the stomach. Colonic distention is not significantly changed in the short-term interval. No dilated small bowel loops seen. No evidence of free intraperitoneal air seen. Small pleural effusions and/or atelectasis at each lung base. IMPRESSION: No significant interval change. Nasogastric tube remains appropriately positioned in the stomach. Colonic distention is not significantly changed. Electronically Signed   By: Franki Cabot M.D.   On: 08/12/2016 13:27     ASSESSMENT AND PLAN:   61 year old male with past medical history of hypertension, history of diverticulosis who was just recently admitted to the surgical service after a traumatic fall from a ladder and having rib fractures who presents to the hospital due to abdominal pain distention and noted to have an ileus.  1.Colonic Ileus-this is secondary to patient's use of narcotics and also poor mobility since his recent fall. CT scan showing no evidence of bowel obstruction.  Patient may benefit from rectal tube insertion. We'll follow up on GI consultation for possibility of rectal tube and diet. NG tube removed March 4  2. Hyponatremia:hypovolemic hypotonic in nature due to to dehydration and use of diuretics.  Continue gentle IV fluids  3. Acute kidney injury due to dehydration which is improved with IV fluids.  4. Status post recent fall and multiple posterior rib fractures: Continue pain control as tolerated but avoid narcotics as much as we can  given his ileus.  Continue incentive spirometry.  5. Leukocytosis: Normalized and was related to dehydration/stress.    Management plans discussed with the patient and family and they are in agreement.  CODE STATUS: Full Code  DVT Prophylaxis: Hep. SQ  TOTAL TIME TAKING CARE OF THIS PATIENT: 27 minutes.   POSSIBLE D/C IN 2-3 DAYS, DEPENDING ON CLINICAL CONDITION.   Carnell Beavers M.D on 08/14/2016 at 8:57 AM  Between 7am to 6pm - Pager - [phone]  After 6pm go to [email or web address] - Proofreader  [facility]  Office  [phone]  CC: Primary care physician; Pcp Not In System

## 2016-08-14 NOTE — Progress Notes (Signed)
Conversation with MD(Mody), verbal ok to give meds with sips of water at this time.

## 2016-08-15 MED ORDER — POLYETHYLENE GLYCOL 3350 17 G PO PACK
17.0000 g | PACK | Freq: Every day | ORAL | Status: DC
  Administered 2016-08-15 – 2016-08-22 (×8): 17 g via ORAL
  Filled 2016-08-15 (×8): qty 1

## 2016-08-15 NOTE — Progress Notes (Signed)
PT is recommending home health, patient walked 350 feet without an assistive device. Clinical Social Worker (CSW) sent recent PT notes to [facility]. Per [facility] will not be able to accept patient because he does not have a skill able need. CSW met with patient and his sister Terrie and made them aware of above. Patient is agreeable to going home with home health.  Patient's sister Luo Ann requested to speak with CSW about this matter. CSW contacted [facility] and made her aware of above. Lou Ann reported that she is not happy with this decision and asked for an appeal. CSW provided emotional support and stated patient is doing well enough to go home and insurance will not pay for SNF. CSW explained that PT can come back in and see him again but it will likely be the same recommendation of home health. CSW requested PT to see patient today. CSW will continue to follow and assist as needed.   Bailey Sample, LCSW [phone]   

## 2016-08-15 NOTE — Care Management Note (Signed)
Case Management Note  Patient Details  Name: Arthur Koch MRN: [id number] Date of Birth: [birth year]  Subjective/Objective: Met with patient at bedside to discuss discharge planning.  He lives alone. He is agreeable to home health. PCP is Dr. Melanie Crazier at [phone] through the city of [place]. Patient states he has been seen by this physician. He will need SN and PT. Refused a HHA. Referral to [facility] as patient has no choice from agencies offered. Ordered a walker and elevated toilet seat from [place] with [facility]. Patient agreeable to POC.                Action/Plan: Following progression.   Expected Discharge Date:                  Expected Discharge Plan:  [place]  In-House Referral:  Clinical Social Work  Discharge planning Services  CM Consult  Post Acute Care Choice:  Durable Medical Equipment, Home Health Choice offered to:  Patient  DME Arranged:  Eelevated commode seat, Science writer DME Agency:  [place]:  RN, PT [facility] Agency:  [place]  Status of Service:  In process, will continue to follow  If discussed at Long Length of Stay Meetings, dates discussed:    Additional Comments:  Jolly Mango, RN 08/15/2016, 2:41 PM

## 2016-08-15 NOTE — Progress Notes (Addendum)
[place] at [place] NAME: Arthur Koch    MR#:  [id number]  DATE OF BIRTH:  03/13/1956  SUBJECTIVE:   Patient has rectal tube in and less abdominal pain  REVIEW OF SYSTEMS:    Review of Systems  Constitutional: Negative for chills and fever.  HENT: Negative for congestion and tinnitus.   Eyes: Negative for blurred vision and double vision.  Respiratory: Negative for cough, shortness of breath and wheezing.   Cardiovascular: Negative for chest pain, orthopnea and PND.  Gastrointestinal: Positive for abdominal pain. Negative for constipation, diarrhea, nausea and vomiting.  Genitourinary: Negative for dysuria and hematuria.  Musculoskeletal: Positive for back pain.  Neurological: Negative for dizziness, sensory change and focal weakness.  All other systems reviewed and are negative.     DRUG ALLERGIES:  No Known Allergies  VITALS:  Blood pressure (!) 146/75, pulse 89, temperature 98.9 F (37.2 C), temperature source Oral, resp. rate 18, height 5\' 11"  (1.803 m), weight 116.1 kg (256 lb), SpO2 94 %.  PHYSICAL EXAMINATION:   Physical Exam  GENERAL:  61 y.o.-year-old patient lying in the bed in NAD.  EYES: Pupils equal, round, reactive to light and accommodation. No scleral icterus. Extraocular muscles intact.  HEENT: Head atraumatic, normocephalic. Oropharynx and nasopharynx clear.  NECK:  Supple, no jugular venous distention. No thyroid enlargement, no tenderness.  LUNGS: Normal breath sounds bilaterally, no wheezing, rales, rhonchi. No use of accessory muscles of respiration.  CARDIOVASCULAR: S1, S2 normal. No murmurs, rubs, or gallops.  ABDOMEN: Decreased bowel sounds with distended abdomen hard to appreciate organomegaly or mass.  Rectal tube in place EXTREMITIES: No cyanosis, clubbing or edema b/l.    NEUROLOGIC: Cranial nerves II through XII are intact.   Globally weak. PSYCHIATRIC: The patient is alert and oriented x 3.   SKIN: Extensive bruising lower back   LABORATORY PANEL:   CBC  Recent Labs Lab 08/14/16 0403  WBC 9.1  HGB 12.4*  HCT 34.4*  PLT 261   ------------------------------------------------------------------------------------------------------------------  Chemistries   Recent Labs Lab 08/11/16 0419  08/14/16 0403  NA 121*  < > 134*  K 3.9  < > 4.3  CL 88*  < > 102  CO2 23  < > 26  GLUCOSE 114*  < > 96  BUN 62*  < > 25*  CREATININE 2.11*  < > 0.97  CALCIUM 8.1*  < > 8.4*  MG 2.5*  --  1.8  AST 32  --   --   ALT 27  --   --   ALKPHOS 49  --   --   BILITOT 2.2*  --   --   < > = values in this interval not displayed. ------------------------------------------------------------------------------------------------------------------  Cardiac Enzymes No results for input(s): TROPONINI in the last 168 hours. ------------------------------------------------------------------------------------------------------------------  RADIOLOGY:  No results found.   ASSESSMENT AND PLAN:   61 year old male with past medical history of hypertension, history of diverticulosis who was just recently admitted to the surgical service after a traumatic fall from a ladder and having rib fractures who presents to the hospital due to abdominal pain distention and noted to have an ileus.  1.Colonic Ileus-this is secondary to patient's use of narcotics and also poor mobility since his recent fall. CT scan showing no evidence of bowel obstruction.  Patient now has rectal tube which is helping with ileus.. Continue Miralax and consider adding clear liquid diet...will d/w GI   2. Hyponatremia:hypovolemic hypotonic in nature due  to to dehydration and use of diuretics.  Continue gentle IV fluids BMP for am  3. Acute kidney injury due to dehydration which has improved with IV fluids.  4. Status post recent fall and multiple posterior rib fractures: Continue pain control as tolerated but avoid  narcotics as much as we can given his ileus.  Continue incentive spirometry.  5. Leukocytosis: Normalized and was related to dehydration/stress.    Management plans discussed with the patient and family and they are in agreement.  CODE STATUS: Full Code  DVT Prophylaxis: Hep. SQ  TOTAL TIME TAKING CARE OF THIS PATIENT: 24 minutes.   POSSIBLE D/C IN 2-3 DAYS, DEPENDING ON CLINICAL CONDITION.   Cyndal Kasson M.D on 08/15/2016 at 11:45 AM  Between 7am to 6pm - Pager - [phone]  After 6pm go to [email or web address] - Proofreader  [facility]  Office  [phone]  CC: Primary care physician; Pcp Not In System

## 2016-08-15 NOTE — Progress Notes (Signed)
Physical Therapy Treatment Patient Details Name: Arthur Koch MRN: [id number] DOB: 1956-02-23 Today's Date: 08/15/2016    History of Present Illness 61 yo male who fell off a ladder and suffered multiple rib fx and has been having severe back pain.  He was discharged to rehab and return 3 days later with an ileus.     PT Comments    Pt did well with PT session and was able to walk around the nurses' station X 3 and though he did need to use rails to get to sitting he did not need any assist.  Pt safe and with ambulation, vitals remained appropriate and he generally appeared to have good confidence despite some guarding from back pain.    Follow Up Recommendations  Home health PT     Equipment Recommendations  None recommended by PT    Recommendations for Other Services       Precautions / Restrictions Precautions Precautions: Back;Fall Precaution Comments: Rectal tube Restrictions Weight Bearing Restrictions: No    Mobility  Bed Mobility Overal bed mobility: Modified Independent Bed Mobility: Sit to Supine     Supine to sit: Supervision     General bed mobility comments: Pt with heavy use of rails, but did not need any phyiscal assist  Transfers Overall transfer level: Independent Equipment used: None Transfers: Sit to/from Stand Sit to Stand: Supervision         General transfer comment: Pt was able to rise to standing w/o direct assist.  He was somewhat guarded but safe and confident with the transition to standing.  Able to to control back to sitting with light UE use  Ambulation/Gait Ambulation/Gait assistance: Supervision Ambulation Distance (Feet): 500 Feet Assistive device: None   Gait velocity: WFL for limited community mobility   General Gait Details: Pt was able to ambulate with consistent speed and no UE use.  He did prefer to walk closely to the wall, but did not need to use it.  He showed good balance and generally needed only minimal cuing  and encouragement.    Stairs            Wheelchair Mobility    Modified Rankin (Stroke Patients Only)       Balance                                    Cognition Arousal/Alertness: Awake/alert Behavior During Therapy: WFL for tasks assessed/performed Overall Cognitive Status: Within Functional Limits for tasks assessed                      Exercises Other Exercises Other Exercises: seated resisted exercises including LAQ, hip flexion marching, hip Ab, hip Ad.      General Comments        Pertinent Vitals/Pain Pain Assessment: 0-10 Pain Score: 2  Pain Location: mid and low back    Home Living                      Prior Function            PT Goals (current goals can now be found in the care plan section) Progress towards PT goals: Progressing toward goals    Frequency    Min 2X/week      PT Plan Current plan remains appropriate    Co-evaluation  End of Session Equipment Utilized During Treatment: Gait belt Activity Tolerance: Patient tolerated treatment well Patient left: with chair alarm set;with call bell/phone within reach Nurse Communication: Mobility status PT Visit Diagnosis: Pain;Unsteadiness on feet (R26.81) Pain - Right/Left: Left Pain - part of body:  (back)     Time: [id number] PT Time Calculation (min) (ACUTE ONLY): 28 min  Charges:  $Gait Training: 8-22 mins $Therapeutic Exercise: 8-22 mins                    G Codes:       Kreg Shropshire, DPT 08/15/2016, 5:31 PM

## 2016-08-15 NOTE — Progress Notes (Signed)
Arthur Bellows MD [address] [place], [place] [address] Phone: [phone] Fax : [phone]  Arthur Koch is being followed for colonic ileus  Subjective: Feels better,  Passing some gas Rectal tube and bag has a good qty of brown liquid stool    Objective: Vital signs in last 24 hours: Vitals:   08/14/16 1514 08/14/16 2041 08/15/16 0509 08/15/16 0754  BP: 137/87 139/78 133/77 (!) 146/75  Pulse: 93 92 89 89  Resp: 18 18 18    Temp: 98.8 F (37.1 C) 98.2 F (36.8 C) 98.7 F (37.1 C) 98.9 F (37.2 C)  TempSrc: Oral Oral Oral Oral  SpO2: 94% 96% 94% 94%  Weight:      Height:       Weight change:   Intake/Output Summary (Last 24 hours) at 08/15/16 [id number] Last data filed at 08/15/16 0520  Gross per 24 hour  Intake             1875 ml  Output             1050 ml  Net              825 ml     Exam: Heart:: Regular rate and rhythm, S1S2 present or without murmur or extra heart sounds Lungs: normal, clear to auscultation and clear to auscultation and percussion Abdomen: soft, nontender, distended normal bowel sounds   Lab Results: CBC Latest Ref Rng & Units 08/14/2016 08/12/2016 08/11/2016  WBC 3.8 - 10.6 K/uL 9.1 10.8(H) 13.3(H)  Hemoglobin 13.0 - 18.0 g/dL 12.4(L) 12.5(L) 12.3(L)  Hematocrit 40.0 - 52.0 % 34.4(L) 36.0(L) 35.0(L)  Platelets 150 - 440 K/uL 261 262 244    CMP Latest Ref Rng & Units 08/14/2016 08/13/2016 08/12/2016  Glucose 65 - 99 mg/dL 96 102(H) 108(H)  BUN 6 - 20 mg/dL 25(H) 30(H) 40(H)  Creatinine 0.61 - 1.24 mg/dL 0.97 0.78 1.18  Sodium 135 - 145 mmol/L 134(L) 132(L) 126(L)  Potassium 3.5 - 5.1 mmol/L 4.3 4.4 4.2  Chloride 101 - 111 mmol/L 102 98(L) 94(L)  CO2 22 - 32 mmol/L 26 28 24   Calcium 8.9 - 10.3 mg/dL 8.4(L) 8.3(L) 8.3(L)  Total Protein 6.5 - 8.1 g/dL - - -  Total Bilirubin 0.3 - 1.2 mg/dL - - -  Alkaline Phos 38 - 126 U/L - - -  AST 15 - 41 U/L - - -  ALT 17 - 63 U/L - - -    Micro Results: No results found for this or any  previous visit (from the past 240 hour(s)). Studies/Results: No results found. Medications: I have reviewed the patient's current medications. Scheduled Meds: . aspirin EC  81 mg Oral Daily  . guaiFENesin  600 mg Oral BID  . heparin  5,000 Units Subcutaneous Q8H  . lidocaine  1 patch Transdermal Q24H  . lisinopril  20 mg Oral Daily  . multivitamin with minerals  1 tablet Oral Daily  . pantoprazole (PROTONIX) IV  40 mg Intravenous Q24H   Continuous Infusions: . sodium chloride 75 mL/hr (08/15/16 0209)   PRN Meds:.acetaminophen **OR** acetaminophen, albuterol, cyclobenzaprine, diazepam, guaiFENesin, ipratropium, morphine injection, ondansetron **OR** ondansetron (ZOFRAN) IV, oxyCODONE, oxyCODONE-acetaminophen, zolpidem   Assessment: Active Problems:   Ileus ([place])  [name] 61 y.o. male admitted after a traumatic fall from ladder with rib fractures , noted to have an ileus. CT abdomen 08/10/16 shows no luminal obstruction of the colon , no significant small bowel dilation but the entire colon was distended.  On admission was in AKI which is resolved. Electrolytes which were low are almost back to normal.   Plan: 1. Continue conservative management with serial abdominal exams 2. Hydration , limit/avoid naroctic use.  3. Rectal tube continue  4. Clear liquids orally, will add miralax today  5. Monitor and replace electrolytes including magnesium > 2 and potassium > 4, check phos 6. If complains of worsening abdominal pain then get X ray abdomen to r/o megacolon vs perforation   It appears the ileus is slowly improving .    LOS: 4 days   Arthur Koch 08/15/2016, 8:32 AM

## 2016-08-16 ENCOUNTER — Inpatient Hospital Stay: Payer: BLUE CROSS/BLUE SHIELD

## 2016-08-16 LAB — BASIC METABOLIC PANEL
Anion gap: 7 (ref 5–15)
BUN: 17 mg/dL (ref 6–20)
CALCIUM: 8.4 mg/dL — AB (ref 8.9–10.3)
CO2: 24 mmol/L (ref 22–32)
CREATININE: 0.87 mg/dL (ref 0.61–1.24)
Chloride: 104 mmol/L (ref 101–111)
GFR calc Af Amer: >60 mL/min (ref >60)
GLUCOSE: 102 mg/dL — AB (ref 65–99)
Potassium: 4.4 mmol/L (ref 3.5–5.1)
Sodium: 135 mmol/L (ref 135–145)

## 2016-08-16 NOTE — Progress Notes (Signed)
[place] [facility] [place] NAME: Rodman Recupero    MR#:  [id number]  DATE OF BIRTH:  Aug 05, 1955  SUBJECTIVE:   Patient doing well This a.m. X-ray this morning shows resolving ileus  REVIEW OF SYSTEMS:    Review of Systems  Constitutional: Negative for chills and fever.  HENT: Negative for congestion and tinnitus.   Eyes: Negative for blurred vision and double vision.  Respiratory: Negative for cough, shortness of breath and wheezing.   Cardiovascular: Negative for chest pain, orthopnea and PND.  Gastrointestinal: Negative for abdominal pain, constipation, diarrhea, nausea and vomiting.  Genitourinary: Negative for dysuria and hematuria.  Musculoskeletal: Positive for back pain.  Neurological: Negative for dizziness, sensory change and focal weakness.  All other systems reviewed and are negative.     DRUG ALLERGIES:  No Known Allergies  VITALS:  Blood pressure 135/77, pulse 88, temperature 98.6 F (37 C), temperature source Oral, resp. rate 18, height 5\' 11"  (1.803 m), weight 116.1 kg (256 lb), SpO2 95 %.  PHYSICAL EXAMINATION:   Physical Exam  GENERAL:  61 y.o.-year-old patient lying in the bed in NAD.  EYES: Pupils equal, round, reactive to light and accommodation. No scleral icterus. Extraocular muscles intact.  HEENT: Head atraumatic, normocephalic. Oropharynx and nasopharynx clear.  NECK:  Supple, no jugular venous distention. No thyroid enlargement, no tenderness.  LUNGS: Normal breath sounds bilaterally, no wheezing, rales, rhonchi. No use of accessory muscles of respiration.  CARDIOVASCULAR: S1, S2 normal. No murmurs, rubs, or gallops.  ABDOMEN: Decreased bowel sounds with distended abdomen but improved softer then yesterday hard to appreciate organomegaly or mass.  Rectal tube in place EXTREMITIES: No cyanosis, clubbing or edema b/l.    NEUROLOGIC: Cranial nerves II through XII are intact.   Globally weak. PSYCHIATRIC: The  patient is alert and oriented x 3.  SKIN: Extensive bruising lower back   LABORATORY PANEL:   CBC  Recent Labs Lab 08/14/16 0403  WBC 9.1  HGB 12.4*  HCT 34.4*  PLT 261   ------------------------------------------------------------------------------------------------------------------  Chemistries   Recent Labs Lab 08/11/16 0419  08/14/16 0403 08/16/16 0352  NA 121*  < > 134* 135  K 3.9  < > 4.3 4.4  CL 88*  < > 102 104  CO2 23  < > 26 24  GLUCOSE 114*  < > 96 102*  BUN 62*  < > 25* 17  CREATININE 2.11*  < > 0.97 0.87  CALCIUM 8.1*  < > 8.4* 8.4*  MG 2.5*  --  1.8  --   AST 32  --   --   --   ALT 27  --   --   --   ALKPHOS 49  --   --   --   BILITOT 2.2*  --   --   --   < > = values in this interval not displayed. ------------------------------------------------------------------------------------------------------------------  Cardiac Enzymes No results for input(s): TROPONINI in the last 168 hours. ------------------------------------------------------------------------------------------------------------------  RADIOLOGY:  Dg Abd 1 View  Result Date: 08/16/2016 CLINICAL DATA:  Follow-up ileus EXAM: ABDOMEN - 1 VIEW COMPARISON:  08/13/2016 FINDINGS: Scattered large and small bowel gas is noted. The previously seen colonic gas distention has resolved in the interval. No free air is noted. IMPRESSION: Decreasing gaseous distension of the colon. Scattered bowel gas remains. Correlation with the physical exam is recommended. Electronically Signed   By: Inez Catalina M.D.   On: 08/16/2016 07:53     ASSESSMENT  AND PLAN:   61 year old male with past medical history of hypertension, history of diverticulosis who was just recently admitted to the surgical service after a traumatic fall from a ladder and having rib fractures who presents to the hospital due to abdominal pain distention and noted to have an ileus.  1.Colonic Ileus-this is secondary to patient's use of  narcotics and also poor mobility since his recent fall. CT scan showing no evidence of bowel obstruction.  Patient now has rectal tube which is helping with ileus. Continue Miralax and clear liquid diet possible   2. Hyponatremia:hypovolemic hypotonic in nature due to to dehydration and use of diuretics.  This has resolved. Continue IVF to keep up with GI loss.  3. Acute kidney injury due to dehydration which has improved with IV fluids.  4. Status post recent fall and multiple posterior rib fractures: Continue pain control as tolerated but avoid narcotics as much as we can given his ileus.  Continue incentive spirometry.  5. Leukocytosis: Normalized and was related to dehydration/stress.    Management plans discussed with the patient and family and they are in agreement.  CODE STATUS: Full Code  DVT Prophylaxis: Hep. SQ  TOTAL TIME TAKING CARE OF THIS PATIENT: 24 minutes.   POSSIBLE D/C IN 2 DAYS, DEPENDING ON CLINICAL CONDITION.   Agustina Witzke M.D on 08/16/2016 at 8:41 AM  Between 7am to 6pm - Pager - [phone]  After 6pm go to [email or web address] - Proofreader  [facility]  Office  [phone]  CC: Primary care physician; Pcp Not In System

## 2016-08-16 NOTE — Progress Notes (Signed)
  [name] MD [address], [place], [place] [address] Phone: [phone] Fax : [phone]  THELONIOUS KAUFFMANN is being followed for colonic ileus   Subjective: Feels better , still has rectal tube, passing gas.    Objective: Vital signs in last 24 hours: Vitals:   08/15/16 0754 08/15/16 1400 08/15/16 2258 08/16/16 0832  BP: (!) 146/75 139/74 120/69 135/77  Pulse: 89 83 93 88  Resp:  18 19 18   Temp: 98.9 F (37.2 C) 98.6 F (37 C) 99 F (37.2 C) 98.6 F (37 C)  TempSrc: Oral Oral Oral Oral  SpO2: 94% 96% 94% 95%  Weight:      Height:       Weight change:   Intake/Output Summary (Last 24 hours) at 08/16/16 0955 Last data filed at 08/16/16 0500  Gross per 24 hour  Intake             4040 ml  Output             2150 ml  Net             1890 ml     Exam: Heart:: Regular rate and rhythm, S1S2 present or without murmur or extra heart sounds Lungs: normal, clear to auscultation and clear to auscultation and percussion Abdomen: distended, soft nontender , bs+   Lab Results: @LABTEST2 @ Micro Results: No results found for this or any previous visit (from the past 240 hour(s)). Studies/Results: Dg Abd 1 View  Result Date: 08/16/2016 CLINICAL DATA:  Follow-up ileus EXAM: ABDOMEN - 1 VIEW COMPARISON:  08/13/2016 FINDINGS: Scattered large and small bowel gas is noted. The previously seen colonic gas distention has resolved in the interval. No free air is noted. IMPRESSION: Decreasing gaseous distension of the colon. Scattered bowel gas remains. Correlation with the physical exam is recommended. Electronically Signed   By: Inez Catalina M.D.   On: 08/16/2016 07:53   Medications: I have reviewed the patient's current medications. Scheduled Meds: . aspirin EC  81 mg Oral Daily  . guaiFENesin  600 mg Oral BID  . heparin  5,000 Units Subcutaneous Q8H  . lidocaine  1 patch Transdermal Q24H  . lisinopril  20 mg Oral Daily  . multivitamin with minerals  1 tablet Oral  Daily  . pantoprazole (PROTONIX) IV  40 mg Intravenous Q24H  . polyethylene glycol  17 g Oral Daily   Continuous Infusions: . sodium chloride 75 mL/hr (08/16/16 0603)   PRN Meds:.acetaminophen **OR** acetaminophen, albuterol, cyclobenzaprine, diazepam, guaiFENesin, ipratropium, morphine injection, ondansetron **OR** ondansetron (ZOFRAN) IV, oxyCODONE, oxyCODONE-acetaminophen, zolpidem   Assessment: Active Problems:   Ileus ([place])  Karolee Ohs y.o.maleadmitted after a traumatic fall from ladder with rib fractures , noted to have an ileus. CT abdomen 08/10/16 shows no luminal obstruction of the colon , no significant small bowel dilation but the entire colon was distended. On admission was in AKI which is resolved. Electrolytes which were low are t back to normal. Presently ileus has resolved   Plan: 1. D/c rectal tube-informed nurse 2. Gradually advance diet to full liquid diet if does well off rectal tube.  3. Continue daily miralax    LOS: 5 days   [name] 08/16/2016, 9:55 AM

## 2016-08-16 NOTE — Progress Notes (Signed)
Rectal tube removed per verbal order from MD Vicente Males. Pt tolerated well.

## 2016-08-17 NOTE — Progress Notes (Signed)
  Arthur Bellows MD [address] [place], [place] [address] Phone: [phone] Fax : [phone]  Arthur Koch is being followed for colonic ileus  Subjective: Feels much better, having multiple bowel movements, passing gas.    Objective: Vital signs in last 24 hours: Vitals:   08/16/16 1409 08/16/16 1950 08/17/16 0414 08/17/16 0700  BP: (!) 143/87 131/82 123/78 119/76  Pulse: 98 99 89 90  Resp:  19 16 18   Temp: (!) 96.8 F (36 C) 98.1 F (36.7 C) 98.2 F (36.8 C) 99 F (37.2 C)  TempSrc: Axillary Oral Oral Oral  SpO2: 96% 95% 98% 98%  Weight:      Height:       Weight change:   Intake/Output Summary (Last 24 hours) at 08/17/16 0920 Last data filed at 08/17/16 0400  Gross per 24 hour  Intake             2625 ml  Output             1225 ml  Net             1400 ml     Exam: Heart:: Regular rate and rhythm, S1S2 present or without murmur or extra heart sounds Lungs: normal, clear to auscultation and clear to auscultation and percussion Abdomen: soft, nontender, normal bowel sounds   Lab Results: @LABTEST2 @ Micro Results: No results found for this or any previous visit (from the past 240 hour(s)). Studies/Results: Dg Abd 1 View  Result Date: 08/16/2016 CLINICAL DATA:  Follow-up ileus EXAM: ABDOMEN - 1 VIEW COMPARISON:  08/13/2016 FINDINGS: Scattered large and small bowel gas is noted. The previously seen colonic gas distention has resolved in the interval. No free air is noted. IMPRESSION: Decreasing gaseous distension of the colon. Scattered bowel gas remains. Correlation with the physical exam is recommended. Electronically Signed   By: Inez Catalina M.D.   On: 08/16/2016 07:53   Medications: I have reviewed the patient's current medications. Scheduled Meds: . aspirin EC  81 mg Oral Daily  . guaiFENesin  600 mg Oral BID  . heparin  5,000 Units Subcutaneous Q8H  . lidocaine  1 patch Transdermal Q24H  . lisinopril  20 mg Oral Daily  . multivitamin  with minerals  1 tablet Oral Daily  . pantoprazole (PROTONIX) IV  40 mg Intravenous Q24H  . polyethylene glycol  17 g Oral Daily   Continuous Infusions: . sodium chloride 75 mL/hr at 08/17/16 0909   PRN Meds:.acetaminophen **OR** acetaminophen, albuterol, cyclobenzaprine, diazepam, guaiFENesin, ipratropium, morphine injection, ondansetron **OR** ondansetron (ZOFRAN) IV, oxyCODONE, oxyCODONE-acetaminophen, zolpidem   Assessment: Active Problems:   Ileus ([place])  Karolee Ohs y.o.maleadmitted after a traumatic fall from ladder with rib fractures , noted to have an ileus. CT abdomen 08/10/16 shows no luminal obstruction of the colon , no significant small bowel dilation but the entire colon was distended. On admission was in AKI which is resolved. Electrolytes which were low are t back to normal. Presently ileus has resolved , having good bowel movements, tolerating oral intake.   Plan: 1. Advance diet ,Continue daily miralax which can be stopped prior to discharge    LOS: 6 days   Arthur Koch 08/17/2016, 9:20 AM

## 2016-08-17 NOTE — Progress Notes (Signed)
While rounding the unit the [facility] visited the Pt. Pt was in the Rm with his sister and son. Pt's sister told the Ch that the Pt was doing well and that he will be going home tomorrow. Patient appeared to be calm and told the [facility] that he does not remember how he fell from the ladder. Patient also stated he has learned from this experience and was looking forward to fully recovery. [place] encourage Pt to focus on raising his children and being a good example to them. [place] also informed the Pt that the [facility] services are available if needed.    08/17/16 1600  Clinical Encounter Type  Visited With Patient;Patient and family together  Visit Type Follow-up;Spiritual support  Referral From [place];Other (Comment)

## 2016-08-17 NOTE — Progress Notes (Signed)
[place] [place] NAME: Arthur Koch    MR#:  [id number]  DATE OF BIRTH:  Nov 14, 1945  SUBJECTIVE:   Patient had bloating feeling last night with full liquid diet and some nausea  This am doing ok has not tried diet Was tolerating clear liquids No abdominal pain  REVIEW OF SYSTEMS:    Review of Systems  Constitutional: Negative for chills and fever.  HENT: Negative for congestion and tinnitus.   Eyes: Negative for blurred vision and double vision.  Respiratory: Negative for cough, shortness of breath and wheezing.   Cardiovascular: Negative for chest pain, orthopnea and PND.  Gastrointestinal: Negative for abdominal pain, constipation, diarrhea, nausea and vomiting.  Genitourinary: Negative for dysuria and hematuria.  Musculoskeletal: Negative for back pain.  Neurological: Negative for dizziness, sensory change and focal weakness.  All other systems reviewed and are negative.     DRUG ALLERGIES:  No Known Allergies  VITALS:  Blood pressure 119/76, pulse 90, temperature 99 F (37.2 C), temperature source Oral, resp. rate 18, height 5\' 11"  (1.803 m), weight 116.1 kg (256 lb), SpO2 98 %.  PHYSICAL EXAMINATION:   Physical Exam  GENERAL:  61 y.o.-year-old patient lying in the bed in NAD.  EYES: Pupils equal, round, reactive to light and accommodation. No scleral icterus. Extraocular muscles intact.  HEENT: Head atraumatic, normocephalic. Oropharynx and nasopharynx clear.  NECK:  Supple, no jugular venous distention. No thyroid enlargement, no tenderness.  LUNGS: Normal breath sounds bilaterally, no wheezing, rales, rhonchi. No use of accessory muscles of respiration.  CARDIOVASCULAR: S1, S2 normal. No murmurs, rubs, or gallops.  ABDOMEN: Decreased bowel sounds with slightly distended abdomen but shows improvement  hard to appreciate organomegaly or mass.  Rectal tube in place EXTREMITIES: No cyanosis, clubbing or edema b/l.     NEUROLOGIC: Cranial nerves II through XII are intact.   Globally weak. PSYCHIATRIC: The patient is alert and oriented x 3.  SKIN: Extensive bruising lower back   LABORATORY PANEL:   CBC  Recent Labs Lab 08/14/16 0403  WBC 9.1  HGB 12.4*  HCT 34.4*  PLT 261   ------------------------------------------------------------------------------------------------------------------  Chemistries   Recent Labs Lab 08/11/16 0419  08/14/16 0403 08/16/16 0352  NA 121*  < > 134* 135  K 3.9  < > 4.3 4.4  CL 88*  < > 102 104  CO2 23  < > 26 24  GLUCOSE 114*  < > 96 102*  BUN 62*  < > 25* 17  CREATININE 2.11*  < > 0.97 0.87  CALCIUM 8.1*  < > 8.4* 8.4*  MG 2.5*  --  1.8  --   AST 32  --   --   --   ALT 27  --   --   --   ALKPHOS 49  --   --   --   BILITOT 2.2*  --   --   --   < > = values in this interval not displayed. ------------------------------------------------------------------------------------------------------------------  Cardiac Enzymes No results for input(s): TROPONINI in the last 168 hours. ------------------------------------------------------------------------------------------------------------------  RADIOLOGY:  Dg Abd 1 View  Result Date: 08/16/2016 CLINICAL DATA:  Follow-up ileus EXAM: ABDOMEN - 1 VIEW COMPARISON:  08/13/2016 FINDINGS: Scattered large and small bowel gas is noted. The previously seen colonic gas distention has resolved in the interval. No free air is noted. IMPRESSION: Decreasing gaseous distension of the colon. Scattered bowel gas remains. Correlation with the physical exam is recommended. Electronically  Signed   By: Inez Catalina M.D.   On: 08/16/2016 07:53     ASSESSMENT AND PLAN:   61 year old male with past medical history of hypertension, history of diverticulosis who was just recently admitted to the surgical service after a traumatic fall from a ladder and having rib fractures who presents to the hospital due to abdominal pain  distention and noted to have an ileus.  1.Colonic Ileus-this is secondary to patient's use of narcotics and also poor mobility since his recent fall. CT scan showing no evidence of bowel obstruction.  Rectal tube removed 3/7 Continue Miralax and follow up GI recs.  2. Hyponatremia:hypovolemic hypotonic in nature due to to dehydration and use of diuretics.  This has resolved. Continue IVF to keep up with GI loss.  3. Acute kidney injury due to dehydration which has improved with IV fluids.  4. Status post recent fall and multiple posterior rib fractures: Continue pain control as tolerated but avoid narcotics as much as we can given his ileus.  Continue incentive spirometry.  5. Leukocytosis: Normalized and was related to dehydration/stress.    Management plans discussed with the patient and family and they are in agreement.  CODE STATUS: Full Code  DVT Prophylaxis: Hep. SQ  TOTAL TIME TAKING CARE OF THIS PATIENT: 22 minutes.   POSSIBLE D/C IN 1-2 DAYS, DEPENDING ON CLINICAL CONDITION.   [name] M.D on 08/17/2016 at 10:44 AM  Between 7am to 6pm - Pager - [phone]  After 6pm go to [email or web address] - Proofreader  [facility]  Office  [phone]  CC: Primary care physician; Pcp Not In System

## 2016-08-17 NOTE — Progress Notes (Signed)
Physical Therapy Treatment Patient Details Name: Arthur Koch MRN: [id number] DOB: 07-27-1955 Today's Date: 08/17/2016    History of Present Illness 61 yo male who fell off a ladder and suffered multiple rib fx and has been having severe back pain.  He was discharged to rehab and return 3 days later with an ileus.     PT Comments    Pt with increased time for bed mobility and heavy use of rail but able to get to EOB without assist.  To bathroom then was able to ambulate 4 times around unit without AD and slow but steady gait.  Overall confidence and quality improving.  A walker was delivered to room and fitted for proper height.  While pt does not need it for in home gait, he was encouraged to use it when ambulating outside and in the community for safety and comfort.   Pt has some difficulty with bed mobility.  Discussed with pt and sister.  Recommended bed rail for home to improve comfort.  She stated she would look into options.   Follow Up Recommendations  Home health PT     Equipment Recommendations  None recommended by PT    Recommendations for Other Services OT consult     Precautions / Restrictions Precautions Precautions: Back;Fall Precaution Comments: rectal tube d/c Restrictions Weight Bearing Restrictions: No    Mobility  Bed Mobility Overal bed mobility: Modified Independent Bed Mobility: Sit to Supine     Supine to sit: Supervision Sit to supine: Min assist   General bed mobility comments: heavy use of rails.  sister in, discussed with pt and sister.  recommended bed rail at home to assist.  she will look into options.  Transfers Overall transfer level: Modified independent Equipment used: None Transfers: Sit to/from Stand Sit to Stand: Modified independent (Device/Increase time) Stand pivot transfers: Modified independent (Device/Increase time)       General transfer comment: gaurded with increased time   Ambulation/Gait Ambulation/Gait  assistance: Supervision Ambulation Distance (Feet): 700 Feet Assistive device: None Gait Pattern/deviations: Step-through pattern;Step-to pattern;Wide base of support;Trunk flexed   Gait velocity interpretation: Below normal speed for age/gender General Gait Details: good balance and overall steady gait.     Stairs            Wheelchair Mobility    Modified Rankin (Stroke Patients Only)       Balance Overall balance assessment: Modified Independent                                  Cognition Arousal/Alertness: Awake/alert Behavior During Therapy: WFL for tasks assessed/performed Overall Cognitive Status: Within Functional Limits for tasks assessed                      Exercises Other Exercises Other Exercises: bathroom with assist for self care.  pt hesitant to attempt this am.   Other Exercises: walker was delivered to room.  fitted for proper height.    General Comments        Pertinent Vitals/Pain Pain Assessment: No/denies pain Pain Location: at rest, some c/o pain upon return from ambualtion and requested pain meds,  did not rate. Pain Descriptors / Indicators: Sore;Discomfort Pain Intervention(s): Limited activity within patient's tolerance;Patient requesting pain meds-RN notified    Home Living  Prior Function            PT Goals (current goals can now be found in the care plan section) Progress towards PT goals: Progressing toward goals    Frequency    Min 2X/week      PT Plan Current plan remains appropriate    Co-evaluation             End of Session Equipment Utilized During Treatment: Gait belt Activity Tolerance: Patient tolerated treatment well Patient left: in bed;with bed alarm set;with call bell/phone within reach;with family/visitor present Nurse Communication: Patient requests pain meds Pain - Right/Left: Left     Time: 3539-1225 PT Time Calculation (min) (ACUTE  ONLY): 28 min  Charges:  $Gait Training: 8-22 mins $Therapeutic Activity: 8-22 mins                    G Codes:       Chesley Noon, PTA 08/17/16, 9:41 AM

## 2016-08-18 ENCOUNTER — Inpatient Hospital Stay: Payer: Self-pay | Admitting: Cardiothoracic Surgery

## 2016-08-18 ENCOUNTER — Inpatient Hospital Stay: Payer: BLUE CROSS/BLUE SHIELD

## 2016-08-18 DIAGNOSIS — E871 Hypo-osmolality and hyponatremia: Secondary | ICD-10-CM

## 2016-08-18 LAB — BASIC METABOLIC PANEL
Anion gap: 9 (ref 5–15)
BUN: 13 mg/dL (ref 6–20)
CHLORIDE: 99 mmol/L — AB (ref 101–111)
CO2: 21 mmol/L — ABNORMAL LOW (ref 22–32)
CREATININE: 0.71 mg/dL (ref 0.61–1.24)
Calcium: 8.8 mg/dL — ABNORMAL LOW (ref 8.9–10.3)
GFR calc Af Amer: >60 mL/min (ref >60)
GLUCOSE: 104 mg/dL — AB (ref 65–99)
Potassium: 4.1 mmol/L (ref 3.5–5.1)
SODIUM: 129 mmol/L — AB (ref 135–145)

## 2016-08-18 LAB — CBC
HCT: 38.6 % — ABNORMAL LOW (ref 40.0–52.0)
Hemoglobin: 13.1 g/dL (ref 13.0–18.0)
MCH: 30.3 pg (ref 26.0–34.0)
MCHC: 34.1 g/dL (ref 32.0–36.0)
MCV: 88.9 fL (ref 80.0–100.0)
Platelets: 327 10*3/uL (ref 150–440)
RBC: 4.34 MIL/uL — ABNORMAL LOW (ref 4.40–5.90)
RDW: 12.5 % (ref 11.5–14.5)
WBC: 6.9 10*3/uL (ref 3.8–10.6)

## 2016-08-18 LAB — MAGNESIUM: MAGNESIUM: 1.8 mg/dL (ref 1.7–2.4)

## 2016-08-18 LAB — PHOSPHORUS: Phosphorus: 3.3 mg/dL (ref 2.5–4.6)

## 2016-08-18 MED ORDER — ALUM & MAG HYDROXIDE-SIMETH 200-200-20 MG/5ML PO SUSP
30.0000 mL | Freq: Four times a day (QID) | ORAL | Status: DC | PRN
  Administered 2016-08-18: 30 mL via ORAL
  Filled 2016-08-18: qty 30

## 2016-08-18 NOTE — Progress Notes (Signed)
Arthur Bellows MD [address], [name], [place] [address] Phone: [phone] Fax : [phone]  Arthur Koch is being followed for ileus Day  Subjective: He was doing very well till yesterday , last night re developed abdominal distension and stopped passing gas.    Objective: Vital signs in last 24 hours: Vitals:   08/17/16 1403 08/17/16 1920 08/18/16 0445 08/18/16 0754  BP: 117/71 139/87 129/75 (!) 144/86  Pulse: 93 96 (!) 103 100  Resp:  18 18 16   Temp: 97.5 F (36.4 C) 98.8 F (37.1 C) 98.6 F (37 C) 97.6 F (36.4 C)  TempSrc: Oral  Oral Oral  SpO2: 94% 97% 97% 95%  Weight:      Height:       Weight change:   Intake/Output Summary (Last 24 hours) at 08/18/16 1352 Last data filed at 08/18/16 1300  Gross per 24 hour  Intake             1800 ml  Output              375 ml  Net             1425 ml     Exam: Heart:: Regular rate and rhythm, S1S2 present or without murmur or extra heart sounds Lungs: normal, clear to auscultation and clear to auscultation and percussion Abdomen: distended, non tender, no guarding rigidity . Bs+ Passing some gas    Lab Results: CMP Latest Ref Rng & Units 08/18/2016 08/16/2016 08/14/2016  Glucose 65 - 99 mg/dL 104(H) 102(H) 96  BUN 6 - 20 mg/dL 13 17 25(H)  Creatinine 0.61 - 1.24 mg/dL 0.71 0.87 0.97  Sodium 135 - 145 mmol/L 129(L) 135 134(L)  Potassium 3.5 - 5.1 mmol/L 4.1 4.4 4.3  Chloride 101 - 111 mmol/L 99(L) 104 102  CO2 22 - 32 mmol/L 21(L) 24 26  Calcium 8.9 - 10.3 mg/dL 8.8(L) 8.4(L) 8.4(L)  Total Protein 6.5 - 8.1 g/dL - - -  Total Bilirubin 0.3 - 1.2 mg/dL - - -  Alkaline Phos 38 - 126 U/L - - -  AST 15 - 41 U/L - - -  ALT 17 - 63 U/L - - -    Micro Results: No results found for this or any previous visit (from the past 240 hour(s)). Studies/Results: Dg Abd 1 View  Result Date: 08/18/2016 CLINICAL DATA:  Distention in abdomen x1week after falling off ladder and landing flat on back. Pt is having  pain/distention in abdomen today. Pmh: No hx of surgeries to abdomen. Hx of diverticulitis EXAM: ABDOMEN - 1 VIEW COMPARISON:  08/13/2016 FINDINGS: There is diffuse gaseous distension of both large and small bowel loops most consistent with ileus. Mild bibasilar atelectasis identified at the lung bases. Small right pleural effusion is present. Numerous acute bilateral rib fractures. IMPRESSION: Persistent ileus pattern. Bibasilar atelectasis. Bilateral rib fractures. Electronically Signed   By: Nolon Nations M.D.   On: 08/18/2016 09:22   Medications: I have reviewed the patient's current medications. Scheduled Meds: . aspirin EC  81 mg Oral Daily  . guaiFENesin  600 mg Oral BID  . heparin  5,000 Units Subcutaneous Q8H  . lidocaine  1 patch Transdermal Q24H  . lisinopril  20 mg Oral Daily  . multivitamin with minerals  1 tablet Oral Daily  . pantoprazole (PROTONIX) IV  40 mg Intravenous Q24H  . polyethylene glycol  17 g Oral Daily   Continuous Infusions: . sodium chloride 100 mL/hr at 08/18/16  0927   PRN Meds:.acetaminophen **OR** acetaminophen, albuterol, alum & mag hydroxide-simeth, cyclobenzaprine, diazepam, guaiFENesin, ipratropium, morphine injection, ondansetron **OR** ondansetron (ZOFRAN) IV, oxyCODONE, oxyCODONE-acetaminophen, zolpidem   Assessment: Active Problems:   Ileus ([place])  Karolee Ohs y.o.maleadmitted after a traumatic fall from ladder with rib fractures , noted to have an ileus. CT abdomen 08/10/16 shows no luminal obstruction of the colon , no significant small bowel dilation but the entire colon was distended. On admission was in AKI which is resolved. Electrolytes which were low returned to normal day before yesterday , he was doing well yesterday and plan was for discharge. Last night had recurrence of abdominal distension. Electrolytes show low sodium and chloride   Plan: 1. Miralax daily  2. Continue rectal tube 3. Correct electrolytes, limit  narcotics 4. Serial abdominal exam  5. If no better after correcting electrolytes, next step would be to try neostigmine   I will sign out to weekend GI doctor to follow up    LOS: 7 days   Arthur Koch 08/18/2016, 1:52 PM

## 2016-08-18 NOTE — Progress Notes (Signed)
PT continues to recommend home health. RN case manager aware of above. Please reconsult if future social work needs arise. CSW signing off.   [employer], LCSW [phone]

## 2016-08-18 NOTE — Progress Notes (Signed)
Pt's felxiseal has drained 540mls liquid brown stool since placement. Pt has walked the circumference of the unit several times, tolerated miralax, and ivf continues. Family at bedside.

## 2016-08-18 NOTE — Progress Notes (Signed)
Arthur Koch at [place] NAME: Arthur Koch    MR#:  [id number]  DATE OF BIRTH:  June 25, 1955  SUBJECTIVE:   Patient still with abdominal distension. Feels bloated this am not tolerating diet, Has heart burn feels bad Not passing gas, very small BM this am  REVIEW OF SYSTEMS:    Review of Systems  Constitutional: Negative for chills and fever.  HENT: Negative for congestion and tinnitus.   Eyes: Negative for blurred vision and double vision.  Respiratory: Negative for cough, shortness of breath and wheezing.   Cardiovascular: Negative for chest pain, orthopnea and PND.  Gastrointestinal: Positive for constipation, heartburn and nausea. Negative for abdominal pain, diarrhea and vomiting.  Genitourinary: Negative for dysuria and hematuria.  Musculoskeletal: Negative for back pain.  Neurological: Negative for dizziness, sensory change and focal weakness.  All other systems reviewed and are negative.     DRUG ALLERGIES:  No Known Allergies  VITALS:  Blood pressure (!) 144/86, pulse 100, temperature 97.6 F (36.4 C), temperature source Oral, resp. rate 16, height 5\' 11"  (1.803 m), weight 116.1 kg (256 lb), SpO2 95 %.  PHYSICAL EXAMINATION:   Physical Exam  GENERAL:  61 y.o.-year-old patient lying in the bed in NAD.  EYES: Pupils equal, round, reactive to light and accommodation. No scleral icterus. Extraocular muscles intact.  HEENT: Head atraumatic, normocephalic. Oropharynx and nasopharynx clear.  NECK:  Supple, no jugular venous distention. No thyroid enlargement, no tenderness.  LUNGS: Normal breath sounds bilaterally, no wheezing, rales, rhonchi. No use of accessory muscles of respiration.  CARDIOVASCULAR: S1, S2 normal. No murmurs, rubs, or gallops.  ABDOMEN: increased bowel sounds with distended abdomen  hard to appreciate organomegaly or mass.  Rectal tube in place EXTREMITIES: No cyanosis, clubbing or edema b/l.     NEUROLOGIC: Cranial nerves II through XII are intact.   Globally weak. PSYCHIATRIC: The patient is alert and oriented x 3.  SKIN: Extensive bruising lower back   LABORATORY PANEL:   CBC  Recent Labs Lab 08/18/16 0433  WBC 6.9  HGB 13.1  HCT 38.6*  PLT 327   ------------------------------------------------------------------------------------------------------------------  Chemistries   Recent Labs Lab 08/14/16 0403  08/18/16 0433  NA 134*  < > 129*  K 4.3  < > 4.1  CL 102  < > 99*  CO2 26  < > 21*  GLUCOSE 96  < > 104*  BUN 25*  < > 13  CREATININE 0.97  < > 0.71  CALCIUM 8.4*  < > 8.8*  MG 1.8  --   --   < > = values in this interval not displayed. ------------------------------------------------------------------------------------------------------------------  Cardiac Enzymes No results for input(s): TROPONINI in the last 168 hours. ------------------------------------------------------------------------------------------------------------------  RADIOLOGY:  No results found.   ASSESSMENT AND PLAN:   61 year old male with past medical history of hypertension, history of diverticulosis who was just recently admitted to the surgical service after a traumatic fall from a ladder and having rib fractures who presents to the hospital due to abdominal pain distention and noted to have an ileus.  1.Colonic Ileus-this is secondary to patient's use of narcotics and also poor mobility since his recent fall. CT scan showing no evidence of bowel obstruction.  Patient will have reinsertion of rectal tube pending KUB D/w Gi Dr Arthur Koch  2. Hyponatremia:hypovolemic hypotonic in nature due to to dehydration and use of diuretics.  Increase IVF this am and repeat labs in am.  3. Acute kidney injury due  to dehydration which has improved with IV fluids.  4. Status post recent fall and multiple posterior rib fractures: Continue pain control as tolerated but avoid narcotics as much  as we can given his ileus.  Continue incentive spirometry.  5. Leukocytosis: Normalized and was related to dehydration/stress.    Management plans discussed with the patient and family and they are in agreement.  CODE STATUS: Full Code  DVT Prophylaxis: Hep. SQ  TOTAL TIME TAKING CARE OF THIS PATIENT: 24 minutes.   POSSIBLE D/C IN 2-3 DAYS, DEPENDING ON CLINICAL CONDITION.   Arthur Koch M.D on 08/18/2016 at 9:20 AM  Between 7am to 6pm - Pager - [phone]  After 6pm go to [email or web address] - Proofreader  [facility]  Office  [phone]  CC: Primary care physician; Pcp Not In System

## 2016-08-18 NOTE — Progress Notes (Signed)
PT Cancellation Note  Patient Details Name: Arthur Koch MRN: [id number] DOB: 1956-02-22   Cancelled Treatment:    Reason Eval/Treat Not Completed: Other (comment).  Pt declined PT at this time (nursing reporting re-inserting rectal tube this morning).  Will re-attempt PT treatment at a later date/time.  Leitha Bleak, PT 08/18/16, 12:31 PM [phone]

## 2016-08-18 NOTE — Progress Notes (Signed)
Shift assessment completed,see flowsheet. Pt's abdomen is firm and distended, bs are tympanic and hyperactive. Pt reporting he feels bloated. Dr. Benjie Karvonen then in to see pt and consulted with Dr. Vicente Males. Pt has had rectal tube placed by this writer at approx 1100, pt tolerated well, an immediate return of gas and liquid brown stool gave pt little relief. Since then, drainage continues in small amounts, pt is encouraged to lie on his l side. Pt is npo but received miralax per Dr. Georgeann Oppenheim recommendation. Family members at bedside. piv #20 intact to lac with iv ns infusing at 134mls/hr,site free of redness and swelling.

## 2016-08-18 NOTE — Progress Notes (Signed)
Pt states he is "miserable." States that immediately after eating his advanced tray to full liquids, he felt bloated, abd pain and miserable. Assisted pt to bathroom several times tonight. Encouraged pt to ambulate in the hall, walked around unit several times.

## 2016-08-18 NOTE — Consult Note (Signed)
MEDICATION RELATED CONSULT NOTE - INITIAL   Pharmacy Consult for electrolytes Indication: pt not tolerating liquid diet, ileus  No Known Allergies  Patient Measurements: Height: 5\' 11"  (180.3 cm) Weight: 256 lb (116.1 kg) IBW/kg (Calculated) : 75.3 Adjusted Body Weight:   Vital Signs: Temp: 97.6 F (36.4 C) (03/09 0754) Temp Source: Oral (03/09 0754) BP: 144/86 (03/09 0754) Pulse Rate: 100 (03/09 0754) Intake/Output from previous day: 03/08 0701 - 03/09 0700 In: 1800 [I.V.:1800] Out: 200 [Urine:200] Intake/Output from this shift: No intake/output data recorded.  Labs:  Recent Labs  08/16/16 0352 08/18/16 0433  WBC  --  6.9  HGB  --  13.1  HCT  --  38.6*  PLT  --  327  CREATININE 0.87 0.71  MG  --  1.8  PHOS  --  3.3   Estimated Creatinine Clearance: 128.8 mL/min (by C-G formula based on SCr of 0.71 mg/dL).   Microbiology: No results found for this or any previous visit (from the past 720 hour(s)).  Medical History: Past Medical History:  Diagnosis Date  . Diverticulitis   . Hypertension     Medications:  Scheduled:  . aspirin EC  81 mg Oral Daily  . guaiFENesin  600 mg Oral BID  . heparin  5,000 Units Subcutaneous Q8H  . lidocaine  1 patch Transdermal Q24H  . lisinopril  20 mg Oral Daily  . multivitamin with minerals  1 tablet Oral Daily  . pantoprazole (PROTONIX) IV  40 mg Intravenous Q24H  . polyethylene glycol  17 g Oral Daily    Assessment: Pt is a 61 year old male who presents with a colonic ileus 2/2 narcotic use and poor mobility due to fall. Pt has not tolerated liquid diet. Rectal tube to be replaced. Pharmacy consulted to monitor electrolytes. AM labs show K 4.1, phos  3.3 and Mg 1.8 Na low at 129 due to hypovolemia- IV fluids ordered  Goal of Therapy:  Maintain electrolyes WNL  Plan:  K, phos, Mg WNL. No supplementation needed at this time. Will recheck in the AM  [place], Pharm.D, BCPS Clinical  Pharmacist  08/18/2016,10:20 AM

## 2016-08-18 NOTE — Progress Notes (Signed)
While rounding the unit the Ch met the Pt's 2 sisters who informed the [facility] that the Pt was not doing well today and that he would benefit from the [facility] visit. [place] visited and met with the Pt. Pt talked about his health struggles but was optimistic he will get a good health report soon. Pt told [place] who was expected his oldest son to visit him today and asked the Chaplain to prayer for him to get better soon. Chaplain offered prayers for healing and then left. [place] also informed the Pt that Chaplain services are available as needed.    08/18/16 1600  Clinical Encounter Type  Visited With Patient;Family  Visit Type Follow-up;Spiritual support  Referral From Family  Consult/Referral To Chaplain  Spiritual Encounters  Spiritual Needs Prayer;Other (Comment)

## 2016-08-18 NOTE — Plan of Care (Signed)
Problem: Bowel/Gastric: Goal: Will not experience complications related to bowel motility Outcome: Progressing Pt continues with confirmed ileus despite interventions. Pt is able to ambulate with steady gait, flexiseal placed this shift.

## 2016-08-19 ENCOUNTER — Inpatient Hospital Stay: Payer: BLUE CROSS/BLUE SHIELD

## 2016-08-19 LAB — COMPREHENSIVE METABOLIC PANEL
ALK PHOS: 95 U/L (ref 38–126)
ALT: 43 U/L (ref 17–63)
AST: 27 U/L (ref 15–41)
Albumin: 2.9 g/dL — ABNORMAL LOW (ref 3.5–5.0)
Anion gap: 6 (ref 5–15)
BILIRUBIN TOTAL: 1.2 mg/dL (ref 0.3–1.2)
BUN: 14 mg/dL (ref 6–20)
CO2: 22 mmol/L (ref 22–32)
CREATININE: 0.64 mg/dL (ref 0.61–1.24)
Calcium: 8.4 mg/dL — ABNORMAL LOW (ref 8.9–10.3)
Chloride: 103 mmol/L (ref 101–111)
GFR calc Af Amer: >60 mL/min (ref >60)
GFR calc non Af Amer: >60 mL/min (ref >60)
GLUCOSE: 89 mg/dL (ref 65–99)
Potassium: 3.9 mmol/L (ref 3.5–5.1)
Sodium: 131 mmol/L — ABNORMAL LOW (ref 135–145)
TOTAL PROTEIN: 6.1 g/dL — AB (ref 6.5–8.1)

## 2016-08-19 LAB — PHOSPHORUS: Phosphorus: 3.2 mg/dL (ref 2.5–4.6)

## 2016-08-19 LAB — MAGNESIUM: Magnesium: 1.7 mg/dL (ref 1.7–2.4)

## 2016-08-19 NOTE — Progress Notes (Signed)
[place] at [place] NAME: Arthur Koch    MR#:  [id number]  DATE OF BIRTH:  February 19, 1956  SUBJECTIVE:   After rectal tube insertion patient says abdominal pain has improved.  REVIEW OF SYSTEMS:    Review of Systems  Constitutional: Negative for chills and fever.  HENT: Negative for congestion and tinnitus.   Eyes: Negative for blurred vision and double vision.  Respiratory: Negative for cough, shortness of breath and wheezing.   Cardiovascular: Negative for chest pain, orthopnea and PND.  Gastrointestinal: Positive for abdominal pain (better). Negative for constipation, diarrhea, heartburn, nausea and vomiting.  Genitourinary: Negative for dysuria and hematuria.  Musculoskeletal: Negative for back pain.  Neurological: Negative for dizziness, sensory change and focal weakness.  All other systems reviewed and are negative.     DRUG ALLERGIES:  No Known Allergies  VITALS:  Blood pressure 127/76, pulse 87, temperature 98.3 F (36.8 C), temperature source Axillary, resp. rate 20, height 5\' 11"  (1.803 m), weight 116.1 kg (256 lb), SpO2 94 %.  PHYSICAL EXAMINATION:   Physical Exam  GENERAL:  61 y.o.-year-old patient lying in the bed in NAD.  EYES: Pupils equal, round, reactive to light and accommodation. No scleral icterus. Extraocular muscles intact.  HEENT: Head atraumatic, normocephalic. Oropharynx and nasopharynx clear.  NECK:  Supple, no jugular venous distention. No thyroid enlargement, no tenderness.  LUNGS: Normal breath sounds bilaterally, no wheezing, rales, rhonchi. No use of accessory muscles of respiration.  CARDIOVASCULAR: S1, S2 normal. No murmurs, rubs, or gallops.  ABDOMEN: Upper active bowel sounds with distended abdomen  hard to appreciate organomegaly or mass.  Rectal tube in place EXTREMITIES: No cyanosis, clubbing or edema b/l.    NEUROLOGIC: Cranial nerves II through XII are intact.   Globally  weak. PSYCHIATRIC: The patient is alert and oriented x 3.  SKIN: Extensive bruising lower back   LABORATORY PANEL:   CBC  Recent Labs Lab 08/18/16 0433  WBC 6.9  HGB 13.1  HCT 38.6*  PLT 327   ------------------------------------------------------------------------------------------------------------------  Chemistries   Recent Labs Lab 08/19/16 0333  NA 131*  K 3.9  CL 103  CO2 22  GLUCOSE 89  BUN 14  CREATININE 0.64  CALCIUM 8.4*  MG 1.7  AST 27  ALT 43  ALKPHOS 95  BILITOT 1.2   ------------------------------------------------------------------------------------------------------------------  Cardiac Enzymes No results for input(s): TROPONINI in the last 168 hours. ------------------------------------------------------------------------------------------------------------------  RADIOLOGY:  Dg Abd 1 View  Result Date: 08/19/2016 CLINICAL DATA:  Pt has ileus; pt also has flexiseal placed; hx/o diverticulitis EXAM: ABDOMEN - 1 VIEW COMPARISON:  Plain films of the abdomen dated 08/18/2016 and 08/16/2016. FINDINGS: Again noted is diffuse gaseous distention of both the large and small bowel, compatible with given history of ileus. No evidence of soft tissue mass or abnormal fluid collections seen. No evidence of free intraperitoneal air seen. Multiple bilateral rib fractures better demonstrated on earlier CT abdomen. IMPRESSION: Persistent gaseous distention of both the large and small bowel, not significantly changed compared to recent plain films, compatible with given history of ileus. Electronically Signed   By: Franki Cabot M.D.   On: 08/19/2016 10:07   Dg Abd 1 View  Result Date: 08/18/2016 CLINICAL DATA:  Distention in abdomen x1week after falling off ladder and landing flat on back. Pt is having pain/distention in abdomen today. Pmh: No hx of surgeries to abdomen. Hx of diverticulitis EXAM: ABDOMEN - 1 VIEW COMPARISON:  08/13/2016 FINDINGS: There is diffuse  gaseous distension of both large and small bowel loops most consistent with ileus. Mild bibasilar atelectasis identified at the lung bases. Small right pleural effusion is present. Numerous acute bilateral rib fractures. IMPRESSION: Persistent ileus pattern. Bibasilar atelectasis. Bilateral rib fractures. Electronically Signed   By: Nolon Nations M.D.   On: 08/18/2016 09:22     ASSESSMENT AND PLAN:   61 year old male with past medical history of hypertension, history of diverticulosis who was just recently admitted to the surgical service after a traumatic fall from a ladder and having rib fractures who presents to the hospital due to abdominal pain distention and noted to have an ileus.  1.Colonic Ileus-this is secondary to patient's use of narcotics and also poor mobility since his recent fall. CT scan showing no evidence of bowel obstruction.  KUB this morning shows persistent ileus Continue rectal tube Defer to GI for use of neostigmine  2. Hyponatremia:hypovolemic hypotonic in nature initially due to to dehydration and use of diuretics.  Now due to GI losses Continue IV fluids  3. Acute kidney injury due to dehydration which has improved with IV fluids.  4. Status post recent fall and multiple posterior rib fractures: Continue pain control as tolerated but avoid narcotics as much as we can given his ileus.  Continue incentive spirometry.  5. Leukocytosis: Normalized and was related to dehydration/stress.    Management plans discussed with the patient and family and they are in agreement.  CODE STATUS: Full Code  DVT Prophylaxis: Hep. SQ  TOTAL TIME TAKING CARE OF THIS PATIENT: 21 minutes.   POSSIBLE D/C IN 3 DAYS, DEPENDING ON CLINICAL CONDITION.   Elbie Statzer M.D on 08/19/2016 at 10:59 AM  Between 7am to 6pm - Pager - [phone]  After 6pm go to [email or web address] - Proofreader  [facility] [place] [facility]  Office  [phone]  CC: Primary care  physician; Pcp Not In System

## 2016-08-19 NOTE — Progress Notes (Signed)
Physical Therapy Treatment Patient Details Name: Arthur Koch MRN: [id number] DOB: 01-Jan-1956 Today's Date: 08/19/2016    History of Present Illness 61 yo male who fell off a ladder and suffered multiple rib fx and has been having severe back pain.  He was discharged to rehab and return 3 days later with an ileus.     PT Comments    Pt ready to ambulate this am.  Out of bed with HOB raises, rails and increased time.  He was able to stand to use urinal without LOB then continued to ambulate x 6 around unit with generally slow but steady gait.  He did require min a to get LE's into bed for comfort.   Follow Up Recommendations  Home health PT     Equipment Recommendations  None recommended by PT    Recommendations for Other Services OT consult     Precautions / Restrictions Precautions Precautions: Back;Fall Precaution Comments: rectal tube re-inserted 3/9 Restrictions Weight Bearing Restrictions: No    Mobility  Bed Mobility Overal bed mobility: Needs Assistance Bed Mobility: Supine to Sit     Supine to sit: Modified independent (Device/Increase time) Sit to supine: Min assist   General bed mobility comments: for LE's  Transfers Overall transfer level: Modified independent Equipment used: None Transfers: Sit to/from Stand Sit to Stand: Modified independent (Device/Increase time) Stand pivot transfers: Modified independent (Device/Increase time)       General transfer comment: gaurded with increased time   Ambulation/Gait Ambulation/Gait assistance: Supervision Ambulation Distance (Feet): 1200 Feet Assistive device: None Gait Pattern/deviations: Step-through pattern Gait velocity: WFL for limited community mobility Gait velocity interpretation: Below normal speed for age/gender General Gait Details: good balance and overall steady gait.     Stairs            Wheelchair Mobility    Modified Rankin (Stroke Patients Only)       Balance  Overall balance assessment: Modified Independent                                  Cognition Arousal/Alertness: Awake/alert Behavior During Therapy: WFL for tasks assessed/performed Overall Cognitive Status: Within Functional Limits for tasks assessed                      Exercises      General Comments        Pertinent Vitals/Pain Pain Assessment: 0-10 Pain Score: 4  Pain Location: generally comfortable at rest and while walking but sit to stand and bed mobility remain painful Pain Descriptors / Indicators: Sore;Discomfort Pain Intervention(s): Limited activity within patient's tolerance    Home Living                      Prior Function            PT Goals (current goals can now be found in the care plan section) Progress towards PT goals: Progressing toward goals    Frequency    Min 2X/week      PT Plan Current plan remains appropriate    Co-evaluation             End of Session Equipment Utilized During Treatment: Gait belt Activity Tolerance: Patient tolerated treatment well Patient left: in bed;with call bell/phone within reach;with family/visitor present   Pain - Right/Left: Left     Time: 0093-8182 PT Time Calculation (min) (ACUTE ONLY): 32  min  Charges:  $Gait Training: 23-37 mins                    G Codes:       Chesley Noon, PTA 08/19/16, 10:57 AM

## 2016-08-19 NOTE — Progress Notes (Signed)
Gastroenterology Progress Note    Arthur Koch 61 y.o. 11-17-1955   Subjective: Feels ok. Denies abdominal pain. Wants to eat/drink. Has been ambulating.  Objective: Vital signs in last 24 hours: Vitals:   08/19/16 0416 08/19/16 1003  BP: 127/76 138/70  Pulse: 87   Resp: 20   Temp: 98.3 F (36.8 C)     Physical Exam: Gen: alert, no acute distress HEENT: anicteric sclera CV: RRR Chest: CTA B Abd: mild distention, nontender, +BS   Lab Results:  Recent Labs  08/18/16 0433 08/19/16 0333  NA 129* 131*  K 4.1 3.9  CL 99* 103  CO2 21* 22  GLUCOSE 104* 89  BUN 13 14  CREATININE 0.71 0.64  CALCIUM 8.8* 8.4*  MG 1.8 1.7  PHOS 3.3 3.2    Recent Labs  08/19/16 0333  AST 27  ALT 43  ALKPHOS 95  BILITOT 1.2  PROT 6.1*  ALBUMIN 2.9*    Recent Labs  08/18/16 0433  WBC 6.9  HGB 13.1  HCT 38.6*  MCV 88.9  PLT 327   No results for input(s): LABPROT, INR in the last 72 hours.    Assessment/Plan: Colonic ileus slow to resolve - 2100 cc output from rectal tube over last 24 hours. Hist abdomen is less distended compared with a week ago but ileus persisting and no significant on Xrays from this week. Na low and primary team correcting. K within normal limits (keep K at least 4.0). With the volume of stool coming out of his rectal tube and decreased abdominal distention on clinical exam I do not think neostigmine is warranted at this time. Would recommend continued electrolyte correction by the primary team. Ok to have ice chips and sips of water. Continue supportive care.    [place] C. 08/19/2016, 1:53 PMPatient ID: Arthur Koch, male   DOB: 1956/05/02, 61 y.o.   MRN: [id number]

## 2016-08-19 NOTE — Consult Note (Signed)
MEDICATION RELATED CONSULT NOTE - INITIAL   Pharmacy Consult for electrolytes Indication: pt not tolerating liquid diet, ileus  No Known Allergies  Patient Measurements: Height: 5\' 11"  (180.3 cm) Weight: 256 lb (116.1 kg) IBW/kg (Calculated) : 75.3 Adjusted Body Weight:   Vital Signs: Temp: 98.3 F (36.8 C) (03/10 0416) Temp Source: Axillary (03/10 0416) BP: 127/76 (03/10 0416) Pulse Rate: 87 (03/10 0416) Intake/Output from previous day: 03/09 0701 - 03/10 0700 In: 3137.1 [P.O.:740; I.V.:2397.1] Out: 2925 [Urine:825; Stool:2100] Intake/Output from this shift: No intake/output data recorded.  Labs:  Recent Labs  08/18/16 0433 08/19/16 0333  WBC 6.9  --   HGB 13.1  --   HCT 38.6*  --   PLT 327  --   CREATININE 0.71 0.64  MG 1.8 1.7  PHOS 3.3 3.2  ALBUMIN  --  2.9*  PROT  --  6.1*  AST  --  27  ALT  --  43  ALKPHOS  --  95  BILITOT  --  1.2   Estimated Creatinine Clearance: 128.8 mL/min (by C-G formula based on SCr of 0.64 mg/dL).   Microbiology: No results found for this or any previous visit (from the past 720 hour(s)).  Medical History: Past Medical History:  Diagnosis Date  . Diverticulitis   . Hypertension     Medications:  Scheduled:  . aspirin EC  81 mg Oral Daily  . guaiFENesin  600 mg Oral BID  . heparin  5,000 Units Subcutaneous Q8H  . lidocaine  1 patch Transdermal Q24H  . lisinopril  20 mg Oral Daily  . multivitamin with minerals  1 tablet Oral Daily  . pantoprazole (PROTONIX) IV  40 mg Intravenous Q24H  . polyethylene glycol  17 g Oral Daily    Assessment: Pt is a 61 year old male who presents with a colonic ileus 2/2 narcotic use and poor mobility due to fall. Pt has not tolerated liquid diet. Rectal tube to be replaced. Pharmacy consulted to monitor electrolytes. AM labs within normal limits    Goal of Therapy:  Maintain electrolyes WNL  Plan:  Order electrolytes with am labs.   Larene [place], PharmD  Clinical  Pharmacist  08/19/2016,7:25 AM

## 2016-08-20 ENCOUNTER — Inpatient Hospital Stay: Payer: BLUE CROSS/BLUE SHIELD

## 2016-08-20 LAB — CBC
HCT: 36.3 % — ABNORMAL LOW (ref 40.0–52.0)
Hemoglobin: 12.7 g/dL — ABNORMAL LOW (ref 13.0–18.0)
MCH: 30.9 pg (ref 26.0–34.0)
MCHC: 35 g/dL (ref 32.0–36.0)
MCV: 88.2 fL (ref 80.0–100.0)
PLATELETS: 241 10*3/uL (ref 150–440)
RBC: 4.11 MIL/uL — ABNORMAL LOW (ref 4.40–5.90)
RDW: 12.3 % (ref 11.5–14.5)
WBC: 5.2 10*3/uL (ref 3.8–10.6)

## 2016-08-20 LAB — BASIC METABOLIC PANEL
ANION GAP: 7 (ref 5–15)
BUN: 12 mg/dL (ref 6–20)
CALCIUM: 8.7 mg/dL — AB (ref 8.9–10.3)
CO2: 23 mmol/L (ref 22–32)
CREATININE: 0.77 mg/dL (ref 0.61–1.24)
Chloride: 105 mmol/L (ref 101–111)
GFR calc Af Amer: >60 mL/min (ref >60)
GLUCOSE: 84 mg/dL (ref 65–99)
Potassium: 3.7 mmol/L (ref 3.5–5.1)
Sodium: 135 mmol/L (ref 135–145)

## 2016-08-20 NOTE — Progress Notes (Signed)
[place] at [place] NAME: Arthur Koch    MR#:  [id number]  DATE OF BIRTH:  April 23, 1956  SUBJECTIVE:   Abdominal pain is improving. Still having bowel movements via rectal tube  REVIEW OF SYSTEMS:    Review of Systems  Constitutional: Negative for chills and fever.  HENT: Negative for congestion and tinnitus.   Eyes: Negative for blurred vision and double vision.  Respiratory: Negative for cough, shortness of breath and wheezing.   Cardiovascular: Negative for chest pain, orthopnea and PND.  Gastrointestinal: Positive for abdominal pain (improved). Negative for constipation, diarrhea, heartburn, nausea and vomiting.  Genitourinary: Negative for dysuria and hematuria.  Musculoskeletal: Negative for back pain.  Neurological: Negative for dizziness, sensory change and focal weakness.  All other systems reviewed and are negative.     DRUG ALLERGIES:  No Known Allergies  VITALS:  Blood pressure 126/78, pulse 86, temperature 98.1 F (36.7 C), temperature source Oral, resp. rate 20, height 5\' 11"  (1.803 m), weight 116.1 kg (256 lb), SpO2 97 %.  PHYSICAL EXAMINATION:   Physical Exam  GENERAL:  61 y.o.-year-old patient lying in the bed in NAD.  EYES: Pupils equal, round, reactive to light and accommodation. No scleral icterus. Extraocular muscles intact.  HEENT: Head atraumatic, normocephalic. Oropharynx and nasopharynx clear.  NECK:  Supple, no jugular venous distention. No thyroid enlargement, no tenderness.  LUNGS: Normal breath sounds bilaterally, no wheezing, rales, rhonchi. No use of accessory muscles of respiration.  CARDIOVASCULAR: S1, S2 normal. No murmurs, rubs, or gallops.  ABDOMEN: Decreased bowel sounds abdomin is softer there is no rebound or guarding hard to appreciate organomegaly or mass.  Rectal tube in place EXTREMITIES: No cyanosis, clubbing or edema b/l.    NEUROLOGIC: Cranial nerves II through XII are intact.     PSYCHIATRIC: The patient is alert and oriented x 3.  SKIN: bruising lower back   LABORATORY PANEL:   CBC  Recent Labs Lab 08/20/16 0356  WBC 5.2  HGB 12.7*  HCT 36.3*  PLT 241   ------------------------------------------------------------------------------------------------------------------  Chemistries   Recent Labs Lab 08/19/16 0333 08/20/16 0356  NA 131* 135  K 3.9 3.7  CL 103 105  CO2 22 23  GLUCOSE 89 84  BUN 14 12  CREATININE 0.64 0.77  CALCIUM 8.4* 8.7*  MG 1.7  --   AST 27  --   ALT 43  --   ALKPHOS 95  --   BILITOT 1.2  --    ------------------------------------------------------------------------------------------------------------------  Cardiac Enzymes No results for input(s): TROPONINI in the last 168 hours. ------------------------------------------------------------------------------------------------------------------  RADIOLOGY:  Dg Abd 1 View  Result Date: 08/19/2016 CLINICAL DATA:  Pt has ileus; pt also has flexiseal placed; hx/o diverticulitis EXAM: ABDOMEN - 1 VIEW COMPARISON:  Plain films of the abdomen dated 08/18/2016 and 08/16/2016. FINDINGS: Again noted is diffuse gaseous distention of both the large and small bowel, compatible with given history of ileus. No evidence of soft tissue mass or abnormal fluid collections seen. No evidence of free intraperitoneal air seen. Multiple bilateral rib fractures better demonstrated on earlier CT abdomen. IMPRESSION: Persistent gaseous distention of both the large and small bowel, not significantly changed compared to recent plain films, compatible with given history of ileus. Electronically Signed   By: Franki Cabot M.D.   On: 08/19/2016 10:07     ASSESSMENT AND PLAN:   61 year old male with past medical history of hypertension, history of diverticulosis who was just recently admitted to the  surgical service after a traumatic fall from a ladder and having rib fractures who presents to the  hospital due to abdominal pain distention and noted to have an ileus.  1.Colonic Ileus-this is secondary to patient's use of narcotics and also poor mobility since his recent fall. CT scan showing no evidence of bowel obstruction.  KUB this morning pending  Continue rectal tube until ileus resolved. When rectal tube was removed a few days ago his symptoms worsened  2. Hyponatremia:hypovolemic hypotonic in nature initially due to to dehydration and use of diuretics.  Now due to GI losses Sodium has improved with increased IV fluids  3. Acute kidney injury due to dehydration which has improved with IV fluids.  4. Status post recent fall and multiple posterior rib fractures: Continue pain control as tolerated but avoid narcotics as much as we can given his ileus.  Continue incentive spirometry.  5. Leukocytosis: Normalized and was related to dehydration/stress.   Physical therapy is recommending home with home health at discharge Management plans discussed with the patient and family and they are in agreement.  CODE STATUS: Full Code  DVT Prophylaxis: Hep. SQ  TOTAL TIME TAKING CARE OF THIS PATIENT: 20 minutes.   POSSIBLE D/C IN 2 DAYS, DEPENDING ON CLINICAL CONDITION.   Shamonique Battiste M.D on 08/20/2016 at 10:32 AM  Between 7am to 6pm - Pager - [phone]  After 6pm go to [email or web address] - Proofreader  [facility] [place] [facility]  Office  [phone]  CC: Primary care physician; Pcp Not In System

## 2016-08-20 NOTE — Progress Notes (Signed)
Gastroenterology Progress Note    Arthur Koch 61 y.o. May 12, 1956   Subjective: Rectal tube came out when he got up today per nursing. Denies abdominal pain, N/V. Sister and brother-in-law at bedside.  Objective: Vital signs in last 24 hours: Vitals:   08/20/16 1053 08/20/16 1413  BP: 124/76 129/68  Pulse:  80  Resp:    Temp:  98.4 F (36.9 C)    Physical Exam: Gen: alert, no acute distress HEENT: anicteric sclera CV: RRR Chest: CTA B Abd: less distended, nontender, +BS   Lab Results:  Recent Labs  08/18/16 0433 08/19/16 0333 08/20/16 0356  NA 129* 131* 135  K 4.1 3.9 3.7  CL 99* 103 105  CO2 21* 22 23  GLUCOSE 104* 89 84  BUN 13 14 12   CREATININE 0.71 0.64 0.77  CALCIUM 8.8* 8.4* 8.7*  MG 1.8 1.7  --   PHOS 3.3 3.2  --     Recent Labs  08/19/16 0333  AST 27  ALT 43  ALKPHOS 95  BILITOT 1.2  PROT 6.1*  ALBUMIN 2.9*    Recent Labs  08/18/16 0433 08/20/16 0356  WBC 6.9 5.2  HGB 13.1 12.7*  HCT 38.6* 36.3*  MCV 88.9 88.2  PLT 327 241   No results for input(s): LABPROT, INR in the last 72 hours.    Assessment/Plan: Colonic ileus - slow to resolve. Correct electrolytes to keep K >=4.0 and Na within normal range. Clinically he is slowly improving and Xray shows decrease in the amount of bowel gas compared to yesterday's Xray. 2100 cc from rectal tube 2 days ago and 1,700 cc from rectal tube yesterday. Will have nursing reinsert rectal tube since he is clinically improving and would like a sustained effect. BM frequency decreased when tube was out last time. Continue on ice chips and sips of water but if no further improvement in ileus on Xray tomorrow then may need total parenteral nutrition. If ileus continues to improve, then consider advancing diet tomorrow. I do not think Neostigmine or a decompressive colonoscopy is needed at this time. Answered all of his and his family's questions. Dr. Vicente Males will reassess tomorrow.    [place]  C. 08/20/2016, 3:40 PM

## 2016-08-20 NOTE — Progress Notes (Signed)
Pts rectal tube came out. MD Mody notified. Per MD OK to keep tube out until GI see pt today.

## 2016-08-20 NOTE — Progress Notes (Signed)
Verbal order from MD Mody, NPO except  MEDS/ICE CHIPS. Order updated

## 2016-08-20 NOTE — Consult Note (Addendum)
MEDICATION RELATED CONSULT NOTE - INITIAL   Pharmacy Consult for electrolytes Indication: pt not tolerating liquid diet, ileus  No Known Allergies  Patient Measurements: Height: 5\' 11"  (180.3 cm) Weight: 256 lb (116.1 kg) IBW/kg (Calculated) : 75.3 Adjusted Body Weight:   Vital Signs: Temp: 98.1 F (36.7 C) (03/11 0734) Temp Source: Oral (03/11 0734) BP: 124/76 (03/11 1053) Pulse Rate: 86 (03/11 0734) Intake/Output from previous day: 03/10 0701 - 03/11 0700 In: -  Out: 3970 [Urine:2270; Stool:1700] Intake/Output from this shift: Total I/O In: -  Out: 200 [Urine:200]  Labs:  Recent Labs  08/18/16 0433 08/19/16 0333 08/20/16 0356  WBC 6.9  --  5.2  HGB 13.1  --  12.7*  HCT 38.6*  --  36.3*  PLT 327  --  241  CREATININE 0.71 0.64 0.77  MG 1.8 1.7  --   PHOS 3.3 3.2  --   ALBUMIN  --  2.9*  --   PROT  --  6.1*  --   AST  --  27  --   ALT  --  43  --   ALKPHOS  --  95  --   BILITOT  --  1.2  --    Estimated Creatinine Clearance: 128.8 mL/min (by C-G formula based on SCr of 0.77 mg/dL).   Microbiology: No results found for this or any previous visit (from the past 720 hour(s)).  Medical History: Past Medical History:  Diagnosis Date  . Diverticulitis   . Hypertension     Medications:  Scheduled:  . aspirin EC  81 mg Oral Daily  . guaiFENesin  600 mg Oral BID  . heparin  5,000 Units Subcutaneous Q8H  . lidocaine  1 patch Transdermal Q24H  . lisinopril  20 mg Oral Daily  . multivitamin with minerals  1 tablet Oral Daily  . pantoprazole (PROTONIX) IV  40 mg Intravenous Q24H  . polyethylene glycol  17 g Oral Daily    Assessment: Pt is a 61 year old male who presents with a colonic ileus 2/2 narcotic use and poor mobility due to fall. Pt has not tolerated liquid diet. Rectal tube to be replaced. Pharmacy consulted to monitor electrolytes. AM labs within normal limits    Goal of Therapy:  Maintain electrolyes WNL  Plan:  Ordered electrolytes on  3/12.    Larene Beach, PharmD  Clinical Pharmacist  08/20/2016,11:05 AM

## 2016-08-21 ENCOUNTER — Inpatient Hospital Stay: Payer: BLUE CROSS/BLUE SHIELD

## 2016-08-21 LAB — BASIC METABOLIC PANEL
Anion gap: 7 (ref 5–15)
BUN: 13 mg/dL (ref 6–20)
CHLORIDE: 106 mmol/L (ref 101–111)
CO2: 22 mmol/L (ref 22–32)
CREATININE: 0.67 mg/dL (ref 0.61–1.24)
Calcium: 8.5 mg/dL — ABNORMAL LOW (ref 8.9–10.3)
GFR calc Af Amer: >60 mL/min (ref >60)
GFR calc non Af Amer: >60 mL/min (ref >60)
Glucose, Bld: 77 mg/dL (ref 65–99)
Potassium: 3.9 mmol/L (ref 3.5–5.1)
Sodium: 135 mmol/L (ref 135–145)

## 2016-08-21 LAB — MAGNESIUM: Magnesium: 1.7 mg/dL (ref 1.7–2.4)

## 2016-08-21 NOTE — Care Management (Signed)
Continues with rectal tube but is passing gas, ambulating well. Plan is advance diet to clears today RNCM will continue to follow and set up home health at discharge

## 2016-08-21 NOTE — Consult Note (Signed)
MEDICATION RELATED CONSULT NOTE - INITIAL   Pharmacy Consult for electrolytes Indication: pt not tolerating liquid diet, ileus  No Known Allergies  Patient Measurements: Height: 5\' 11"  (180.3 cm) Weight: 256 lb (116.1 kg) IBW/kg (Calculated) : 75.3 Adjusted Body Weight:   Vital Signs: Temp: 98.6 F (37 C) (03/12 0758) Temp Source: Oral (03/12 0758) BP: 129/80 (03/12 0758) Pulse Rate: 85 (03/12 0758) Intake/Output from previous day: 03/11 0701 - 03/12 0700 In: 1272 [P.O.:100; I.V.:1172] Out: 2200 [Urine:1900; Stool:300] Intake/Output from this shift: Total I/O In: -  Out: 350 [Urine:350]  Labs:  Recent Labs  08/19/16 0333 08/20/16 0356 08/21/16 0606  WBC  --  5.2  --   HGB  --  12.7*  --   HCT  --  36.3*  --   PLT  --  241  --   CREATININE 0.64 0.77 0.67  MG 1.7  --  1.7  PHOS 3.2  --   --   ALBUMIN 2.9*  --   --   PROT 6.1*  --   --   AST 27  --   --   ALT 43  --   --   ALKPHOS 95  --   --   BILITOT 1.2  --   --    Estimated Creatinine Clearance: 128.8 mL/min (by C-G formula based on SCr of 0.67 mg/dL).   Microbiology: No results found for this or any previous visit (from the past 720 hour(s)).  Medical History: Past Medical History:  Diagnosis Date  . Diverticulitis   . Hypertension     Medications:  Scheduled:  . aspirin EC  81 mg Oral Daily  . guaiFENesin  600 mg Oral BID  . heparin  5,000 Units Subcutaneous Q8H  . lidocaine  1 patch Transdermal Q24H  . lisinopril  20 mg Oral Daily  . multivitamin with minerals  1 tablet Oral Daily  . pantoprazole (PROTONIX) IV  40 mg Intravenous Q24H  . polyethylene glycol  17 g Oral Daily    Assessment: Pt is a 61 year old male who presents with a colonic ileus 2/2 narcotic use and poor mobility due to fall. Pt has not tolerated liquid diet. Rectal tube to be replaced. Pharmacy consulted to monitor electrolytes. AM labs within normal limits    Goal of Therapy:  Maintain electrolyes WNL  Plan:   Labs have been within normal limits for past 3 days. Pharmacy to sign off on consult. Please re-consult if further electrolyte management warranted.   Larene Beach, PharmD  Clinical Pharmacist  08/21/2016,10:44 AM

## 2016-08-21 NOTE — Progress Notes (Signed)
Rectal tube was displaced by patient getting out of bed to ambulate. MD informed, he stated to leave out for now and monitor, if patient gets worse will have to replace

## 2016-08-21 NOTE — Progress Notes (Signed)
Arthur Bellows MD [address], [place], [place] [address] Phone: [phone] Fax : [phone]  Arthur Koch is being followed for ileus  Subjective: Feels better, was walking with aid, passing gas. Still has rectal tube   Objective: Vital signs in last 24 hours: Vitals:   08/20/16 1413 08/20/16 1958 08/21/16 0617 08/21/16 0758  BP: 129/68 123/73 121/76 129/80  Pulse: 80 87 77 85  Resp:  18 18 17   Temp: 98.4 F (36.9 C) 97.5 F (36.4 C) 98.4 F (36.9 C) 98.6 F (37 C)  TempSrc: Axillary Oral Axillary Oral  SpO2: 97% 97% 96% 96%  Weight:      Height:       Weight change:   Intake/Output Summary (Last 24 hours) at 08/21/16 0850 Last data filed at 08/21/16 0737  Gross per 24 hour  Intake             1272 ml  Output             2050 ml  Net             -778 ml     Exam: Heart:: Regular rate and rhythm, S1S2 present or without murmur or extra heart sounds Lungs: normal, clear to auscultation and clear to auscultation and percussion Abdomen: distended , soft , non tender , Bs+, rectal tube inplace with brown stool.   Lab Results: @LABTEST2 @ Micro Results: No results found for this or any previous visit (from the past 240 hour(s)). Studies/Results: Dg Abd 1 View  Result Date: 08/21/2016 CLINICAL DATA:  Status post fall from a ladder 2 weeks ago with multiple rib fractures. Abdominal discomfort and ileus. Rectal tube. EXAM: ABDOMEN - 1 VIEW COMPARISON:  KUB of August 20, 2016 FINDINGS: There is a moderate amount of gas within the stomach. There is gas within mildly distended loops of small bowel the colon is not significantly distended. A small amount of rectal gas is observed. No free extraluminal gas collections are observed. There is increased density at the left lung base consistent with atelectasis or scarring. IMPRESSION: Slight overall decrease in the volume of gas within mildly distended small bowel loops consistent with improving ileus. No evidence of  perforation. Electronically Signed   By: David  Martinique M.D.   On: 08/21/2016 08:26   Dg Abd 1 View  Result Date: 08/20/2016 CLINICAL DATA:  Ileus EXAM: ABDOMEN - 1 VIEW COMPARISON:  08/19/2016 FINDINGS: Scattered large and small bowel gas is noted. Some decrease in the amount of large and small bowel gas is noted when compared with the prior exam. No free air is seen. No other focal abnormality is noted. IMPRESSION: Decrease in the degree of large and small bowel gas when compared with the prior exam. Electronically Signed   By: Inez Catalina M.D.   On: 08/20/2016 10:59   Dg Abd 1 View  Result Date: 08/19/2016 CLINICAL DATA:  Pt has ileus; pt also has flexiseal placed; hx/o diverticulitis EXAM: ABDOMEN - 1 VIEW COMPARISON:  Plain films of the abdomen dated 08/18/2016 and 08/16/2016. FINDINGS: Again noted is diffuse gaseous distention of both the large and small bowel, compatible with given history of ileus. No evidence of soft tissue mass or abnormal fluid collections seen. No evidence of free intraperitoneal air seen. Multiple bilateral rib fractures better demonstrated on earlier CT abdomen. IMPRESSION: Persistent gaseous distention of both the large and small bowel, not significantly changed compared to recent plain films, compatible with given history of ileus. Electronically  Signed   By: Franki Cabot M.D.   On: 08/19/2016 10:07   Medications: I have reviewed the patient's current medications. Scheduled Meds: . aspirin EC  81 mg Oral Daily  . guaiFENesin  600 mg Oral BID  . heparin  5,000 Units Subcutaneous Q8H  . lidocaine  1 patch Transdermal Q24H  . lisinopril  20 mg Oral Daily  . multivitamin with minerals  1 tablet Oral Daily  . pantoprazole (PROTONIX) IV  40 mg Intravenous Q24H  . polyethylene glycol  17 g Oral Daily   Continuous Infusions: . sodium chloride 100 mL/hr at 08/21/16 0317   PRN Meds:.acetaminophen **OR** acetaminophen, albuterol, alum & mag hydroxide-simeth,  cyclobenzaprine, diazepam, guaiFENesin, ipratropium, morphine injection, ondansetron **OR** ondansetron (ZOFRAN) IV, oxyCODONE, oxyCODONE-acetaminophen, zolpidem   Assessment: Active Problems:   Ileus ([place])  Karolee Oh[age] y.o.maleadmitted after a traumatic fall from ladder with rib fractures , noted to have an ileus. CT abdomen 08/10/16 shows no luminal obstruction of the colon , no significant small bowel dilation but the entire colon was distended. On admission was in AKI which  resolved.  he was doing well and ileus recovered but had recurrence pf  Ileus after feeding and was noted to have low electrolytes yet again, Presently electrolytes have normolized and has rectal tube still in place.   Plan: 1. Advance to clear liquids today  2. Monitor electrolytes closely while we advance diet  3. Continue rectal tube till we know he is tolerating diet    LOS: 10 days   Arthur Koch 08/21/2016, 8:50 AM

## 2016-08-21 NOTE — Progress Notes (Signed)
[facility] NAME: Arthur Koch    MR#:  [id number]  DATE OF BIRTH:  Jun 08, 1956  SUBJECTIVE:   Abdominal pain improved.  Still having some liquid stool and has rectal tube. Abd. Distension improved.  NO n/V or any other symptoms.    REVIEW OF SYSTEMS:    Review of Systems  Constitutional: Negative for chills and fever.  HENT: Negative for congestion and tinnitus.   Eyes: Negative for blurred vision and double vision.  Respiratory: Negative for cough, shortness of breath and wheezing.   Cardiovascular: Negative for chest pain, orthopnea and PND.  Gastrointestinal: Positive for abdominal pain (improved). Negative for constipation, diarrhea, heartburn, nausea and vomiting.  Genitourinary: Negative for dysuria and hematuria.  Musculoskeletal: Negative for back pain.  Neurological: Negative for dizziness, sensory change and focal weakness.  All other systems reviewed and are negative.     DRUG ALLERGIES:  No Known Allergies  VITALS:  Blood pressure 129/80, pulse 85, temperature 98.6 F (37 C), temperature source Oral, resp. rate 17, height 5\' 11"  (1.803 m), weight 116.1 kg (256 lb), SpO2 96 %.  PHYSICAL EXAMINATION:   Physical Exam  GENERAL:  61 y.o.-year-old patient lying in the bed in NAD.  EYES: Pupils equal, round, reactive to light and accommodation. No scleral icterus. Extraocular muscles intact.  HEENT: Head atraumatic, normocephalic. Oropharynx and nasopharynx clear.  NECK:  Supple, no jugular venous distention. No thyroid enlargement, no tenderness.  LUNGS: Normal breath sounds bilaterally, no wheezing, rales, rhonchi. No use of accessory muscles of respiration.  CARDIOVASCULAR: S1, S2 normal. No murmurs, rubs, or gallops.  ABDOMEN: Soft, Slightly distended, + BS no organomegaly.  Ne rebound rigidity. Rectal tube in place with liquid stool.   EXTREMITIES: No cyanosis, clubbing or edema b/l.    NEUROLOGIC: Cranial  nerves II through XII are intact.    PSYCHIATRIC: The patient is alert and oriented x 3.  SKIN: bruising lower back   LABORATORY PANEL:   CBC  Recent Labs Lab 08/20/16 0356  WBC 5.2  HGB 12.7*  HCT 36.3*  PLT 241   ------------------------------------------------------------------------------------------------------------------  Chemistries   Recent Labs Lab 08/19/16 0333  08/21/16 0606  NA 131*  < > 135  K 3.9  < > 3.9  CL 103  < > 106  CO2 22  < > 22  GLUCOSE 89  < > 77  BUN 14  < > 13  CREATININE 0.64  < > 0.67  CALCIUM 8.4*  < > 8.5*  MG 1.7  --  1.7  AST 27  --   --   ALT 43  --   --   ALKPHOS 95  --   --   BILITOT 1.2  --   --   < > = values in this interval not displayed. ------------------------------------------------------------------------------------------------------------------  Cardiac Enzymes No results for input(s): TROPONINI in the last 168 hours. ------------------------------------------------------------------------------------------------------------------  RADIOLOGY:  Dg Abd 1 View  Result Date: 08/21/2016 CLINICAL DATA:  Status post fall from a ladder 2 weeks ago with multiple rib fractures. Abdominal discomfort and ileus. Rectal tube. EXAM: ABDOMEN - 1 VIEW COMPARISON:  KUB of August 20, 2016 FINDINGS: There is a moderate amount of gas within the stomach. There is gas within mildly distended loops of small bowel the colon is not significantly distended. A small amount of rectal gas is observed. No free extraluminal gas collections are observed. There is increased density at the left lung base consistent  with atelectasis or scarring. IMPRESSION: Slight overall decrease in the volume of gas within mildly distended small bowel loops consistent with improving ileus. No evidence of perforation. Electronically Signed   By: David  Martinique M.D.   On: 08/21/2016 08:26   Dg Abd 1 View  Result Date: 08/20/2016 CLINICAL DATA:  Ileus EXAM: ABDOMEN - 1  VIEW COMPARISON:  08/19/2016 FINDINGS: Scattered large and small bowel gas is noted. Some decrease in the amount of large and small bowel gas is noted when compared with the prior exam. No free air is seen. No other focal abnormality is noted. IMPRESSION: Decrease in the degree of large and small bowel gas when compared with the prior exam. Electronically Signed   By: Inez Catalina M.D.   On: 08/20/2016 10:59     ASSESSMENT AND PLAN:   61 year old male with past medical history of hypertension, history of diverticulosis who was just recently admitted to the surgical service after a traumatic fall from a ladder and having rib fractures who presents to the hospital due to abdominal pain distention and noted to have an ileus.  1.Colonic Ileus-this is secondary to patient's use of narcotics and also poor mobility since his recent fall. - CT scan showing no evidence of bowel obstruction.  - KUB this morning showing improvement in the gaseous distension. Seen by GI and will start on clear liquids today.  - Continue rectal tube until ileus resolved. When rectal tube was removed a few days ago his symptoms worsened  2. Hyponatremia:hypovolemic hypotonic in nature initially due to to dehydration and use of diuretics.  Now due to GI losses and improved w/ IV fluids.   - will d/c IV fluids as started on clear liquids today.   3. Acute kidney injury due to dehydration - resolved.    4. Status post recent fall and multiple posterior rib fractures: Continue pain control as tolerated but avoid narcotics as much as we can given his ileus.  - pain improved since past 2 weeks.   5. Leukocytosis: Normalized and was related to dehydration/stress.   Physical therapy is recommending home with home health at discharge  Management plans discussed with the patient and family and they are in agreement.  CODE STATUS: Full Code  DVT Prophylaxis: Hep. SQ  TOTAL TIME TAKING CARE OF THIS PATIENT: 25 minutes.    POSSIBLE D/C IN 1-2 DAYS, DEPENDING ON CLINICAL CONDITION.   Henreitta Leber M.D on 08/21/2016 at 1:41 PM  Between 7am to 6pm - Pager - [phone]  After 6pm go to [email or web address] - Proofreader  [facility]  Office  [phone]  CC: Primary care physician; Pcp Not In System

## 2016-08-21 NOTE — Progress Notes (Signed)
Physical Therapy Treatment Patient Details Name: Arthur Koch MRN: [id number] DOB: 08/05/1955 Today's Date: 08/21/2016    History of Present Illness 61 yo male who fell off a ladder and suffered multiple rib fx and has been having severe back pain.  He was discharged to rehab and return 3 days later with an ileus.     PT Comments    Pt continues with slow bed mobility due to general discomfort from ribs.  He raised HOB up fully to get into sitting but is able to do without assist.  To return to supine he does need assist for LE's for comfort.  He was able to ambulate x 6 around unit without AD.  Gait generally steady with increased speed today.     Follow Up Recommendations  Home health PT     Equipment Recommendations  None recommended by PT    Recommendations for Other Services       Precautions / Restrictions Precautions Precautions: Back;Fall Precaution Booklet Issued: No Precaution Comments: rectal tube re-inserted 3/9 Restrictions Weight Bearing Restrictions: No    Mobility  Bed Mobility Overal bed mobility: Needs Assistance Bed Mobility: Supine to Sit;Sit to Supine     Supine to sit: Modified independent (Device/Increase time) Sit to supine: Min assist   General bed mobility comments: for LE's.  increased time and slow movements  Transfers Overall transfer level: Modified independent Equipment used: None Transfers: Sit to/from Stand Sit to Stand: Modified independent (Device/Increase time) Stand pivot transfers: Modified independent (Device/Increase time)       General transfer comment: gaurded with increased time   Ambulation/Gait Ambulation/Gait assistance: Supervision Ambulation Distance (Feet): 1200 Feet Assistive device: None Gait Pattern/deviations: Step-through pattern Gait velocity: WFL for limited community mobility Gait velocity interpretation: Below normal speed for age/gender General Gait Details: good balance and overall steady  gait.  Increased speed today   Stairs            Wheelchair Mobility    Modified Rankin (Stroke Patients Only)       Balance Overall balance assessment: Modified Independent                                  Cognition Arousal/Alertness: Awake/alert Behavior During Therapy: WFL for tasks assessed/performed Overall Cognitive Status: Within Functional Limits for tasks assessed                      Exercises      General Comments        Pertinent Vitals/Pain Pain Assessment: 0-10 Pain Score: 6  Pain Location: generally comfortable at rest and while walking but sit to stand and bed mobility remain painful Pain Descriptors / Indicators: Sore;Discomfort Pain Intervention(s): Limited activity within patient's tolerance    Home Living                      Prior Function            PT Goals (current goals can now be found in the care plan section) Progress towards PT goals: Progressing toward goals    Frequency    Min 2X/week      PT Plan Current plan remains appropriate    Co-evaluation             End of Session Equipment Utilized During Treatment: Gait belt Activity Tolerance: Patient tolerated treatment well Patient left: in bed;with call bell/phone  within reach;with family/visitor present   Pain - Right/Left: Left     Time: 5615-3794 PT Time Calculation (min) (ACUTE ONLY): 32 min  Charges:  $Gait Training: 8-22 mins $Therapeutic Activity: 8-22 mins                    G Codes:       Chesley Noon 09/19/16, 10:54 AM

## 2016-08-21 NOTE — Progress Notes (Signed)
Initial Nutrition Assessment  DOCUMENTATION CODES:   Obesity unspecified  INTERVENTION:  1. Continue with diet advancement per GI 2. Patient may need TPN if ileus is not resolved, per CT appears to be improving.  NUTRITION DIAGNOSIS:   Inadequate oral intake related to altered GI function as evidenced by per patient/family report  GOAL:   Patient will meet greater than or equal to 90% of their needs  MONITOR:   PO intake, I & O's, Labs  REASON FOR ASSESSMENT:   LOS    ASSESSMENT:   Arthur Koch is a 61 y.o. male with a known history of hypertension, diverticulitis presents to the emergency department with the complaint of abdominal pain. Patient was admitted to this facility with multiple posterior rib fractures following a fall from a ladder  Patient CLD/FLD/NPO going on 10 days now r/t ileus. PO intake PTA was good - no evidence of malnutrition, patient denies weight loss. UBW 252# Denies any nausea or abdominal pain today Consumed all of liquid tray this morning - but still has poor appetite. Rectal tube in - was having a BM during my visit. Nutrition-Focused physical exam completed. Findings are no fat depletion, no muscle depletion, and mild edema.  Labs and medications reviewed: MVI w/ Minerals, Miralax  Diet Order:  Diet clear liquid Room service appropriate? Yes; Fluid consistency: Thin  Skin:  Reviewed, no issues  Last BM:  08/20/2016  Height:   Ht Readings from Last 1 Encounters:  08/10/16 5\' 11"  (1.803 m)    Weight:   Wt Readings from Last 1 Encounters:  08/10/16 256 lb (116.1 kg)    Ideal Body Weight:  78.18 kg  BMI:  Body mass index is 35.7 kg/m.  Estimated Nutritional Needs:   Kcal:  2200-2600 calories  Protein:  116-140 gm  Fluid:  >/= 2.2L  EDUCATION NEEDS:   No education needs identified at this time  Arthur Koch. Denny Lave, MS, RD LDN Inpatient Clinical Dietitian Pager [phone]

## 2016-08-22 MED ORDER — PANTOPRAZOLE SODIUM 40 MG PO TBEC
40.0000 mg | DELAYED_RELEASE_TABLET | Freq: Every day | ORAL | Status: DC
  Administered 2016-08-23: 40 mg via ORAL
  Filled 2016-08-22: qty 1

## 2016-08-22 NOTE — Progress Notes (Signed)
[place] [facility] [place] NAME: Arthur Koch    MR#:  [id number]  DATE OF BIRTH:  12-04-1955  SUBJECTIVE:   Rectal tube was accidentally removed yesterday, but patient is still having loose bowel movements, no abdominal distention, tolerating a clear liquid diet well.  REVIEW OF SYSTEMS:    Review of Systems  Constitutional: Negative for chills and fever.  HENT: Negative for congestion and tinnitus.   Eyes: Negative for blurred vision and double vision.  Respiratory: Negative for cough, shortness of breath and wheezing.   Cardiovascular: Negative for chest pain, orthopnea and PND.  Gastrointestinal: Positive for abdominal pain (improved). Negative for constipation, diarrhea, heartburn, nausea and vomiting.  Genitourinary: Negative for dysuria and hematuria.  Musculoskeletal: Negative for back pain.  Neurological: Negative for dizziness, sensory change and focal weakness.  All other systems reviewed and are negative.     DRUG ALLERGIES:  No Known Allergies  VITALS:  Blood pressure 128/83, pulse 77, temperature 98.9 F (37.2 C), temperature source Oral, resp. rate 18, height 5\' 11"  (1.803 m), weight 116.1 kg (256 lb), SpO2 97 %.  PHYSICAL EXAMINATION:   Physical Exam  GENERAL:  61 y.o.-year-old patient lying in the bed in NAD.  EYES: Pupils equal, round, reactive to light and accommodation. No scleral icterus. Extraocular muscles intact.  HEENT: Head atraumatic, normocephalic. Oropharynx and nasopharynx clear.  NECK:  Supple, no jugular venous distention. No thyroid enlargement, no tenderness.  LUNGS: Normal breath sounds bilaterally, no wheezing, rales, rhonchi. No use of accessory muscles of respiration.  CARDIOVASCULAR: S1, S2 normal. No murmurs, rubs, or gallops.  ABDOMEN: Soft, Slightly distended, + BS no organomegaly.  No rebound rigidity.  EXTREMITIES: No cyanosis, clubbing or edema b/l.    NEUROLOGIC: Cranial nerves II  through XII are intact.    PSYCHIATRIC: The patient is alert and oriented x 3.  SKIN: bruising lower back   LABORATORY PANEL:   CBC  Recent Labs Lab 08/20/16 0356  WBC 5.2  HGB 12.7*  HCT 36.3*  PLT 241   ------------------------------------------------------------------------------------------------------------------  Chemistries   Recent Labs Lab 08/19/16 0333  08/21/16 0606  NA 131*  < > 135  K 3.9  < > 3.9  CL 103  < > 106  CO2 22  < > 22  GLUCOSE 89  < > 77  BUN 14  < > 13  CREATININE 0.64  < > 0.67  CALCIUM 8.4*  < > 8.5*  MG 1.7  --  1.7  AST 27  --   --   ALT 43  --   --   ALKPHOS 95  --   --   BILITOT 1.2  --   --   < > = values in this interval not displayed. ------------------------------------------------------------------------------------------------------------------  Cardiac Enzymes No results for input(s): TROPONINI in the last 168 hours. ------------------------------------------------------------------------------------------------------------------  RADIOLOGY:  Dg Abd 1 View  Result Date: 08/21/2016 CLINICAL DATA:  Status post fall from a ladder 2 weeks ago with multiple rib fractures. Abdominal discomfort and ileus. Rectal tube. EXAM: ABDOMEN - 1 VIEW COMPARISON:  KUB of August 20, 2016 FINDINGS: There is a moderate amount of gas within the stomach. There is gas within mildly distended loops of small bowel the colon is not significantly distended. A small amount of rectal gas is observed. No free extraluminal gas collections are observed. There is increased density at the left lung base consistent with atelectasis or scarring. IMPRESSION: Slight overall decrease in the  volume of gas within mildly distended small bowel loops consistent with improving ileus. No evidence of perforation. Electronically Signed   By: David  Martinique M.D.   On: 08/21/2016 08:26     ASSESSMENT AND PLAN:   61 year old male with past medical history of hypertension,  history of diverticulosis who was just recently admitted to the surgical service after a traumatic fall from a ladder and having rib fractures who presents to the hospital due to abdominal pain distention and noted to have an ileus.  1.Colonic Ileus-this was secondary to patient's use of narcotics and also poor mobility since his recent fall. - CT scan showing no evidence of bowel obstruction.  - KUB yesterday showing improvement in the gaseous distension. Started on clear liquids and tolerating it well.  Abdominal pain distension improved.  - rectal tube removed accidentally yesterday but having loose bowel movements with no distension.  - will advance to full liquids and monitor. Discussed plan of care with GI.   2. Hyponatremia: hypovolemic hypotonic in nature due to to dehydration and use of diuretics.  - Now due to GI losses and improved w/ IV fluids.   - will check Sodium in a.m. Again. Stable yesterday.   3. Acute kidney injury due to dehydration - resolved.    4. Status post recent fall and multiple posterior rib fractures: Continue pain control as tolerated but avoid narcotics as much as we can given his ileus.  - pain improved since past 2 weeks.   5. Leukocytosis: Normalized and was related to dehydration/stress.   Will arrange Home health upon discharge. Possible d/c home tomorrow if tolerating PO well and electrolytes stable.   Management plans discussed with the patient and family and they are in agreement.  CODE STATUS: Full Code  DVT Prophylaxis: Hep. SQ  TOTAL TIME TAKING CARE OF THIS PATIENT: 25 minutes.   POSSIBLE D/C IN 1-2 DAYS, DEPENDING ON CLINICAL CONDITION.   Henreitta Leber M.D on 08/22/2016 at 12:51 PM  Between 7am to 6pm - Pager - [phone]  After 6pm go to [email or web address] - Proofreader  [facility]  Office  [phone]  CC: Primary care physician; Pcp Not In System

## 2016-08-22 NOTE — Progress Notes (Signed)
Physical Therapy Treatment Patient Details Name: Arthur Koch MRN: [id number] DOB: 1955-07-07 Today's Date: 08/22/2016    History of Present Illness 61 yo male who fell off a ladder and suffered multiple rib fx and has been having severe back pain.  He was discharged to rehab and return 3 days later with an ileus.     PT Comments    To edge of bed with HOB raised and use of rail.  Ambulated x 7 around unit and was able to go up/down 8 stairs with 1 rail on right with min guard.  He has 5 steps to enter his home with right rail.  Overall speed with transitions, bed mobility and gait improved today.   Follow Up Recommendations  Home health PT     Equipment Recommendations  None recommended by PT    Recommendations for Other Services OT consult     Precautions / Restrictions Precautions Precautions: Back;Fall Precaution Booklet Issued: No Restrictions Weight Bearing Restrictions: No    Mobility  Bed Mobility Overal bed mobility: Modified Independent Bed Mobility: Supine to Sit     Supine to sit: Supervision;HOB elevated     General bed mobility comments: used rail but improved comfort and speed today  Transfers Overall transfer level: Modified independent Equipment used: None Transfers: Sit to/from Stand Sit to Stand: Modified independent (Device/Increase time) Stand pivot transfers: Modified independent (Device/Increase time)          Ambulation/Gait   Ambulation Distance (Feet): 1400 Feet Assistive device: None Gait Pattern/deviations: Step-through pattern Gait velocity: WFL for limited community mobility   General Gait Details: good balance and overall steady gait.  Increased speed today   Stairs Stairs: Yes   Stair Management: One rail Right Number of Stairs: 8    Wheelchair Mobility    Modified Rankin (Stroke Patients Only)       Balance Overall balance assessment: Modified Independent                                   Cognition Arousal/Alertness: Awake/alert Behavior During Therapy: WFL for tasks assessed/performed Overall Cognitive Status: Within Functional Limits for tasks assessed                      Exercises      General Comments        Pertinent Vitals/Pain Pain Assessment: 0-10 Pain Score: 2  Pain Location: 2 with walking/rest.  increased with twist/pressure/cough Pain Descriptors / Indicators: Sore;Discomfort Pain Intervention(s): Limited activity within patient's tolerance    Home Living                      Prior Function            PT Goals (current goals can now be found in the care plan section) Progress towards PT goals: Progressing toward goals    Frequency    Min 2X/week      PT Plan Current plan remains appropriate    Co-evaluation             End of Session Equipment Utilized During Treatment: Gait belt Activity Tolerance: Patient tolerated treatment well Patient left: in bed;with call bell/phone within reach   Pain - Right/Left: Left     Time: 1057-1110 PT Time Calculation (min) (ACUTE ONLY): 13 min  Charges:  $Gait Training: 8-22 mins  G Codes:       Chesley Noon, PTA 08/22/16, 11:40 AM

## 2016-08-22 NOTE — Progress Notes (Signed)
Pt has been able to pass flatus all through the shift. Pt able to move bowels without difficulty. Able to sleep in between care. Tolerating diet without difficulty.

## 2016-08-22 NOTE — Progress Notes (Signed)
While rounding the unit the Chaplain visited the Pt in Rm 147. Pt was seating on the bed watching tv and appeared to be in great spirit. Pt told Ch, he was doing much better today and had order his breakfast which arrived while the chaplain was in the Rm. Pt was pleased with treatment and was looking forward to being discharged in few days, he said. Chester observes Pt appears to be making steady progress towards fully recovery, he is talking more today than he did last week, and he is not in pain. Shelby encouraged the Pt to focus on his recovery, provided a ministry of prayer and presence. Chaplain also informed the Pt that the [facility] services are available as needed.

## 2016-08-22 NOTE — Care Management (Signed)
[facility] notified of anticipated discharge in next 1-2 days.

## 2016-08-23 LAB — BASIC METABOLIC PANEL
ANION GAP: 5 (ref 5–15)
BUN: 8 mg/dL (ref 6–20)
CHLORIDE: 105 mmol/L (ref 101–111)
CO2: 27 mmol/L (ref 22–32)
Calcium: 8.7 mg/dL — ABNORMAL LOW (ref 8.9–10.3)
Creatinine, Ser: 0.8 mg/dL (ref 0.61–1.24)
GFR calc non Af Amer: >60 mL/min (ref >60)
Glucose, Bld: 104 mg/dL — ABNORMAL HIGH (ref 65–99)
POTASSIUM: 3.8 mmol/L (ref 3.5–5.1)
SODIUM: 137 mmol/L (ref 135–145)

## 2016-08-23 NOTE — Care Management (Signed)
Met with patient to discuss discharge planning. He agrees to home health nursing through [facility]. Rolling walker and bedside commode delivered also by [facility]. Spoke with Dr. Verdell Carmine and he will orders this items prior to discharge. Corene Cornea with [facility] notified of patient discharge. No other RNCM needs.

## 2016-08-23 NOTE — Progress Notes (Signed)
Clinical Education officer, museum (CSW) met with patient to confirm D/C plan. Patient reported that he is going home with home health today. CSW explained that patient does not qualify for SNF because he is walking 1400 Feet with PT. Patient verbalized his understanding and reported that his sister will pick him up today to go home. Please reconsult if future social work needs arise. CSW signing off.   [employer], LCSW [phone]

## 2016-08-23 NOTE — Progress Notes (Signed)
Pt being discharged home today. PIV removed. Discharge instructions reviewed with pt, all questions answered, he is in agreement with plan. He will have home health follow him at home, he has all needed DME. No new prescriptions needed at discharge. He will follow up with GI in 4 weeks, appointment has been made already. He will also have a follow up BMP drawn at PCP which has been scheduled for Friday 08/25/16 at 9:30am, per MD Vicente Males. He is leaving with all of his belongings, will be transported home via family member.

## 2016-08-23 NOTE — Progress Notes (Signed)
[name] MD [address], [place], [place] [address] Phone: [phone] Fax : [phone]  CHEVIS WEISENSEL is being followed for ileus  Subjective: Feels good, passing gas, has liquid bowel movements while on miralax   Objective: Vital signs in last 24 hours: Vitals:   08/22/16 1608 08/22/16 1923 08/23/16 0456 08/23/16 0749  BP: 119/72 115/75 130/80 119/81  Pulse:  92 84 86  Resp: 18 16 18 18   Temp: 98.4 F (36.9 C) 98.5 F (36.9 C) 98.4 F (36.9 C) 98.2 F (36.8 C)  TempSrc: Oral Oral Oral Oral  SpO2:  95% 92% 95%  Weight:      Height:       Weight change:   Intake/Output Summary (Last 24 hours) at 08/23/16 1142 Last data filed at 08/23/16 1009  Gross per 24 hour  Intake             1680 ml  Output             1000 ml  Net              680 ml     Exam: Heart:: Regular rate and rhythm, S1S2 present or without murmur or extra heart sounds Lungs: normal, clear to auscultation and clear to auscultation and percussion Abdomen: soft, nontender, normal bowel sounds   Lab Results: BMP Latest Ref Rng & Units 08/23/2016 08/21/2016 08/20/2016  Glucose 65 - 99 mg/dL 104(H) 77 84  BUN 6 - 20 mg/dL 8 13 12   Creatinine 0.61 - 1.24 mg/dL 0.80 0.67 0.77  Sodium 135 - 145 mmol/L 137 135 135  Potassium 3.5 - 5.1 mmol/L 3.8 3.9 3.7  Chloride 101 - 111 mmol/L 105 106 105  CO2 22 - 32 mmol/L 27 22 23   Calcium 8.9 - 10.3 mg/dL 8.7(L) 8.5(L) 8.7(L)    Micro Results: No results found for this or any previous visit (from the past 240 hour(s)). Studies/Results: No results found. Medications: I have reviewed the patient's current medications. Scheduled Meds: . aspirin EC  81 mg Oral Daily  . guaiFENesin  600 mg Oral BID  . heparin  5,000 Units Subcutaneous Q8H  . lidocaine  1 patch Transdermal Q24H  . lisinopril  20 mg Oral Daily  . multivitamin with minerals  1 tablet Oral Daily  . pantoprazole  40 mg Oral Daily   Continuous Infusions: PRN Meds:.acetaminophen  **OR** acetaminophen, albuterol, alum & mag hydroxide-simeth, cyclobenzaprine, diazepam, guaiFENesin, ipratropium, morphine injection, ondansetron **OR** ondansetron (ZOFRAN) IV, oxyCODONE, oxyCODONE-acetaminophen, zolpidem   Assessment: Active Problems:   Ileus (HCC)  Ileus likely due to low electrolytes has resolved . Still has loose stools which is likely due to miralax.   Plan: 1. Continue consuming foods rich in salts, potassium at home such as saltine crackers, bananas, orange juice. Avoid artificial sweeteners  2. F/u with pcp in 2-3 days for repeat BMP to ensure electrolytes are normal  3. I expect the loose stools to resolve once his miralax has been stopped today ,. If continues then would have low threshold to check for C diff as an out patient   4. He is welcome to see me at my office if needed although I will be away on vacation for the next 2 weeks.   I will sign off.  Please call me if any further GI concerns or questions.  We would like to thank you for the opportunity to participate in the care of [place].    LOS: 12  days   [name] 08/23/2016, 11:42 AM

## 2016-08-24 NOTE — Discharge Summary (Signed)
[place] [facility] [place] NAME: Arthur Koch    MR#:  [id number]  DATE OF BIRTH:  August 21, 1955  DATE OF ADMISSION:  08/10/2016 ADMITTING PHYSICIAN: Ubaldo Glassing Hugelmeyer, DO  DATE OF DISCHARGE: 08/23/2016  1:12 PM  PRIMARY CARE PHYSICIAN: Pcp Not In System    ADMISSION DIAGNOSIS:  Hyponatremia [E87.1] Generalized abdominal pain [R10.84] AKI (acute kidney injury) ([place]) [N17.9]  DISCHARGE DIAGNOSIS:  Active Problems:   Ileus ([place])   SECONDARY DIAGNOSIS:   Past Medical History:  Diagnosis Date  . Diverticulitis   . Hypertension     HOSPITAL COURSE:   61 year old male with past medical history of hypertension, history of diverticulosis who was just recently admitted to the surgical service after a traumatic fall from a ladder and having rib fractures who presents to the hospital due to abdominal pain distention and noted to have an ileus.  1.Colonic Ileus-this was secondary to patient's use of narcotics and also poor mobility since his recent fall. he had a CT scan done on admission which showed no evidence of obstruction. -Patient was seen both by gastroenterology and also general surgery. He was managed conservatively with NG tube decompression, rectal tube and supportive care with IV fluids anti-emetics. -Patient after conservative therapy did slowly improve. His rectal tube was removed and his diet was slowly advanced but his ileus recurred and therefore the rectal tube had to be replaced but not the NG tube. Patient was given laxatives with MiraLAX and continued on supportive care as mentioned. -Routine/serial KUBs were obtained which showed improvement in his gaseous distention of his colon. His diet was slowly then readvanced from a clear liquid to a soft diet and she is not tolerating and his rectal tube has been removed and he is having loose stools and passing flatus. He clinically feels much better and therefore being discharged home with  close follow-up with gastroenterology as an outpatient.  2. Hyponatremia: hypovolemic hypotonic in nature due to to dehydration and use of diuretics.  -Patient was gently hydrated with IV fluids and his sodium has improved and normalized. His diuretics were held but no they can be resumed as his electrolytes have normalized.  3. Acute kidney injury due to dehydration - secondary to dehydration and ongoing volume loss from GI losses. He was given IV fluids and his renal function is back to baseline.    4. Status post recent fall and multiple posterior rib fractures: His pain is significantly improved and currently being controlled without narcotics and he was advised to avoid them and use Tylenol and Motrin as needed for his pain.  5. Leukocytosis: Normalized and was related to dehydration/stress.   he is being arranged for home health services prior to discharge.  DISCHARGE CONDITIONS:   Stable.   CONSULTS OBTAINED:  Treatment Team:  [name], MD  DRUG ALLERGIES:  No Known Allergies  DISCHARGE MEDICATIONS:   Allergies as of 08/23/2016   No Known Allergies     Medication List    STOP taking these medications   oxyCODONE 5 MG immediate release tablet Commonly known as:  Oxy IR/ROXICODONE     TAKE these medications   alum & mag hydroxide-simeth 200-200-20 MG/5ML suspension Commonly known as:  MAALOX/MYLANTA Take 30 mLs by mouth every 4 (four) hours as needed for indigestion or heartburn.   aspirin EC 81 MG tablet Take 81 mg by mouth daily.   cyclobenzaprine 5 MG tablet Commonly known as:  FLEXERIL Take 1 tablet (5  mg total) by mouth 3 (three) times daily as needed for muscle spasms.   diazepam 5 MG tablet Commonly known as:  VALIUM Take 1 tablet (5 mg total) by mouth every 8 (eight) hours as needed for muscle spasms.   guaiFENesin 600 MG 12 hr tablet Commonly known as:  MUCINEX Take 1 tablet (600 mg total) by mouth 2 (two) times daily as needed for cough or to  loosen phlegm.   ibuprofen 800 MG tablet Commonly known as:  ADVIL,MOTRIN Take 1 tablet (800 mg total) by mouth every 8 (eight) hours as needed.   lisinopril-hydrochlorothiazide 20-12.5 MG tablet Commonly known as:  PRINZIDE,ZESTORETIC Take 1 tablet by mouth daily.   multivitamin tablet Take 1 tablet by mouth daily.   ondansetron 4 MG tablet Commonly known as:  ZOFRAN Take 1 tablet (4 mg total) by mouth every 6 (six) hours as needed for nausea.   pantoprazole 40 MG tablet Commonly known as:  PROTONIX Take 40 mg by mouth daily.         DISCHARGE INSTRUCTIONS:   DIET:  Regular diet  DISCHARGE CONDITION:  Stable  ACTIVITY:  Activity as tolerated  OXYGEN:  Home Oxygen: No.   Oxygen Delivery: room air  DISCHARGE LOCATION:  Home with HOme health RN.    If you experience worsening of your admission symptoms, develop shortness of breath, life threatening emergency, suicidal or homicidal thoughts you must seek medical attention immediately by calling 911 or calling your MD immediately  if symptoms less severe.  You Must read complete instructions/literature along with all the possible adverse reactions/side effects for all the Medicines you take and that have been prescribed to you. Take any new Medicines after you have completely understood and accpet all the possible adverse reactions/side effects.   Please note  You were cared for by a hospitalist during your hospital stay. If you have any questions about your discharge medications or the care you received while you were in the hospital after you are discharged, you can call the unit and asked to speak with the hospitalist on call if the hospitalist that took care of you is not available. Once you are discharged, your primary care physician will handle any further medical issues. Please note that NO REFILLS for any discharge medications will be authorized once you are discharged, as it is imperative that you return to  your primary care physician (or establish a relationship with a primary care physician if you do not have one) for your aftercare needs so that they can reassess your need for medications and monitor your lab values.     Today   Abdominal pain improved. Passing flatus and having loose BM's. Tolerating soft diet well. Wants to go home.   VITAL SIGNS:  Blood pressure 119/81, pulse 86, temperature 98.2 F (36.8 C), temperature source Oral, resp. rate 18, height 5\' 11"  (1.803 m), weight 116.1 kg (256 lb), SpO2 95 %.  I/O:  No intake or output data in the 24 hours ending 08/24/16 1332  PHYSICAL EXAMINATION:  GENERAL:  61 y.o.-year-old patient lying in the bed with no acute distress.  EYES: Pupils equal, round, reactive to light and accommodation. No scleral icterus. Extraocular muscles intact.  HEENT: Head atraumatic, normocephalic. Oropharynx and nasopharynx clear.  NECK:  Supple, no jugular venous distention. No thyroid enlargement, no tenderness.  LUNGS: Normal breath sounds bilaterally, no wheezing, rales,rhonchi. No use of accessory muscles of respiration.  CARDIOVASCULAR: S1, S2 normal. No murmurs, rubs, or gallops.  ABDOMEN: Soft, non-tender, non-distended. Bowel sounds present. No organomegaly or mass.  EXTREMITIES: No pedal edema, cyanosis, or clubbing.  NEUROLOGIC: Cranial nerves II through XII are intact. No focal motor or sensory defecits b/l.  PSYCHIATRIC: The patient is alert and oriented x 3. SKIN: No obvious rash, lesion, or ulcer.   DATA REVIEW:   CBC  Recent Labs Lab 08/20/16 0356  WBC 5.2  HGB 12.7*  HCT 36.3*  PLT 241    Chemistries   Recent Labs Lab 08/19/16 0333  08/21/16 0606 08/23/16 0539  NA 131*  < > 135 137  K 3.9  < > 3.9 3.8  CL 103  < > 106 105  CO2 22  < > 22 27  GLUCOSE 89  < > 77 104*  BUN 14  < > 13 8  CREATININE 0.64  < > 0.67 0.80  CALCIUM 8.4*  < > 8.5* 8.7*  MG 1.7  --  1.7  --   AST 27  --   --   --   ALT 43  --   --   --    ALKPHOS 95  --   --   --   BILITOT 1.2  --   --   --   < > = values in this interval not displayed.  Cardiac Enzymes No results for input(s): TROPONINI in the last 168 hours.  Microbiology Results  No results found for this or any previous visit.  RADIOLOGY:  No results found.    Management plans discussed with the patient, family and they are in agreement.  CODE STATUS:  Code Status History    Date Active Date Inactive Code Status Order ID Comments User Context   08/11/2016  2:45 AM 08/23/2016  4:18 PM Full Code [id number]  Harvie Bridge, DO Inpatient   08/06/2016 12:55 AM 08/08/2016  4:48 PM Full Code [id number]  Florene Glen, MD ED      TOTAL TIME TAKING CARE OF THIS PATIENT: 40 minutes.    Henreitta Leber M.D on 08/24/2016 at 1:32 PM  Between 7am to 6pm - Pager - [phone]  After 6pm go to [email or web address] - Proofreader  [facility]  Office  [phone]  CC: Primary care physician; Pcp Not In System

## 2016-08-29 NOTE — Progress Notes (Signed)
[facility]  Patient Status: not taken under care, nurse arrived at patient's home for scheduled [facility] visit.  Patient sitting on couch did not appear to be in any distress.  Patient and patient's sister Arthur Koch present during visit and reports that he has been doing much better and he feels like he is getting much stronger.  Patient was able to verbalize each medication and why and when he takes it.  He is able to demonstrate use of insentive spirometer, Patient refuses home health services at this time. Notified Marshell Garfinkel, CM.    Arthur Koch 08/29/2016, 9:33 AM

## 2016-09-20 ENCOUNTER — Ambulatory Visit (INDEPENDENT_AMBULATORY_CARE_PROVIDER_SITE_OTHER): Payer: BLUE CROSS/BLUE SHIELD | Admitting: Gastroenterology

## 2016-09-20 ENCOUNTER — Other Ambulatory Visit: Payer: Self-pay

## 2016-09-20 ENCOUNTER — Encounter: Payer: Self-pay | Admitting: Gastroenterology

## 2016-09-20 VITALS — BP 137/82 | HR 77 | Temp 97.9°F | Ht 71.5 in | Wt 237.6 lb

## 2016-09-20 DIAGNOSIS — K567 Ileus, unspecified: Secondary | ICD-10-CM | POA: Diagnosis not present

## 2016-09-20 NOTE — Progress Notes (Signed)
Primary Care Physician: Pcp Not In System  Primary Gastroenterologist:  Dr. [name]   Chief Complaint  Patient presents with  . Hospitalization Follow-up    HPI: Arthur Koch is a 61 y.o. male who is here today for a hospital follow up .  I had recently seen him when he was admitted to the hospital in 08/2016 after a traumatic fall from ladder with rib fractures , noted to have an ileus. CT abdomen 08/10/16 shows no luminal obstruction of the colon , no significant small bowel dilation but the entire colon was distended. He improved after electrolytes were replaced  and utilization of a rectal tube.   Since discharge doing well , regular bowel movements. No diarrhea.Not on any diuretics.   He has a primary care doctor and says his he had lab work and his electrolytes were normal.   Takes Ibuprofen regularly. Advised to use very sparingly.   Current Outpatient Prescriptions  Medication Sig Dispense Refill  . aspirin EC 81 MG tablet Take 81 mg by mouth daily.    Marland Kitchen ibuprofen (ADVIL,MOTRIN) 800 MG tablet Take 1 tablet (800 mg total) by mouth every 8 (eight) hours as needed. 30 tablet 0  . lisinopril-hydrochlorothiazide (PRINZIDE,ZESTORETIC) 20-12.5 MG tablet Take 1 tablet by mouth daily.  2  . pantoprazole (PROTONIX) 40 MG tablet Take 40 mg by mouth daily.  2  . alum & mag hydroxide-simeth (MAALOX/MYLANTA) 200-200-20 MG/5ML suspension Take 30 mLs by mouth every 4 (four) hours as needed for indigestion or heartburn. (Patient not taking: Reported on 09/20/2016) 355 mL 0  . cyclobenzaprine (FLEXERIL) 5 MG tablet Take 1 tablet (5 mg total) by mouth 3 (three) times daily as needed for muscle spasms. (Patient not taking: Reported on 09/20/2016) 30 tablet 0  . diazepam (VALIUM) 5 MG tablet Take 1 tablet (5 mg total) by mouth every 8 (eight) hours as needed for muscle spasms. (Patient not taking: Reported on 09/20/2016) 20 tablet 0  . guaiFENesin (MUCINEX) 600 MG 12 hr tablet Take 1 tablet (600 mg  total) by mouth 2 (two) times daily as needed for cough or to loosen phlegm. (Patient not taking: Reported on 09/20/2016) 20 tablet 0  . Multiple Vitamin (MULTIVITAMIN) tablet Take 1 tablet by mouth daily.    . ondansetron (ZOFRAN) 4 MG tablet Take 1 tablet (4 mg total) by mouth every 6 (six) hours as needed for nausea. (Patient not taking: Reported on 09/20/2016) 20 tablet 0   No current facility-administered medications for this visit.     Allergies as of 09/20/2016  . (No Known Allergies)    ROS:  General: Negative for anorexia, weight loss, fever, chills, fatigue, weakness. ENT: Negative for hoarseness, difficulty swallowing , nasal congestion. CV: Negative for chest pain, angina, palpitations, dyspnea on exertion, peripheral edema.  Respiratory: Negative for dyspnea at rest, dyspnea on exertion, cough, sputum, wheezing.  GI: See history of present illness. GU:  Negative for dysuria, hematuria, urinary incontinence, urinary frequency, nocturnal urination.  Endo: Negative for unusual weight change.    Physical Examination:   BP 137/82   Pulse 77   Temp 97.9 F (36.6 C) (Oral)   Ht 5' 11.5" (1.816 m)   Wt 237 lb 9.6 oz (107.8 kg)   BMI 32.68 kg/m   General: Well-nourished, well-developed in no acute distress.  Eyes: No icterus. Conjunctivae pink. Mouth: Oropharyngeal mucosa moist and pink , no lesions erythema or exudate. Lungs: Clear to auscultation bilaterally. Non-labored. Heart: Regular rate and rhythm,  no murmurs rubs or gallops.  Abdomen: Bowel sounds are normal, nontender, nondistended, no hepatosplenomegaly or masses, no abdominal bruits or hernia , no rebound or guarding.   Extremities: No lower extremity edema. No clubbing or deformities. Neuro: Alert and oriented x 3.  Grossly intact. Skin: Warm and dry, no jaundice.   Psych: Alert and cooperative, normal mood and affect.   Imaging Studies: No results found.  Assessment and Plan:   Arthur Koch is a 61  y.o. y/o male here today to follow up for an ileus after a traumatic fall . Feels well and ileus has resolved. Advised to d/c nsaids.   Plan  1. Follow up with PCP 2. Colonoscopy for colorectal cancer screening to be decided by PCP based on last one performed he cannot recollect the date.   Dr [name]  MD Follow up as needd.Return to work note provided

## 2016-10-04 ENCOUNTER — Other Ambulatory Visit: Payer: Self-pay

## 2016-10-04 DIAGNOSIS — S2249XA Multiple fractures of ribs, unspecified side, initial encounter for closed fracture: Secondary | ICD-10-CM

## 2016-10-06 ENCOUNTER — Ambulatory Visit (INDEPENDENT_AMBULATORY_CARE_PROVIDER_SITE_OTHER): Payer: BLUE CROSS/BLUE SHIELD | Admitting: Cardiothoracic Surgery

## 2016-10-06 ENCOUNTER — Ambulatory Visit
Admission: RE | Admit: 2016-10-06 | Discharge: 2016-10-06 | Disposition: A | Discharge status: Home / Self Care | Payer: BLUE CROSS/BLUE SHIELD | Source: Ambulatory Visit | Attending: Cardiothoracic Surgery | Admitting: Cardiothoracic Surgery

## 2016-10-06 ENCOUNTER — Encounter: Payer: Self-pay | Admitting: Cardiothoracic Surgery

## 2016-10-06 VITALS — BP 161/89 | HR 93 | Temp 98.3°F | Ht 71.5 in | Wt 239.0 lb

## 2016-10-06 DIAGNOSIS — I77810 Thoracic aortic ectasia: Secondary | ICD-10-CM

## 2016-10-06 DIAGNOSIS — S2249XA Multiple fractures of ribs, unspecified side, initial encounter for closed fracture: Secondary | ICD-10-CM | POA: Diagnosis not present

## 2016-10-06 DIAGNOSIS — X58XXXA Exposure to other specified factors, initial encounter: Secondary | ICD-10-CM | POA: Diagnosis not present

## 2016-10-06 NOTE — Progress Notes (Signed)
  Patient ID: Arthur Koch, male   DOB: 03-17-1956, 61 y.o.   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 He was involved in a trauma back in February sustaining several fractures of his posterior ribs. He also had some lumbar spine fractures. He states that he has some discomfort but that he has stopped taking all the nonsteroidal anti-inflammatory drugs is now managing his pain which is Tylenol alone. He had a CT scan made at his initial presentation. 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 He does have a history of hypertension and currently takes a combination of lisinopril and hydrochlorothiazide. He does not have any significant pain today although he states that his back occasionally still bothers him.   Vitals:   10/06/16 0931  BP: (!) 161/89  Pulse: 93  Temp: 98.3 F (36.8 C)     EXAM:    Resp: Lungs are clear bilaterally.  No respiratory distress, normal effort. Heart:  Regular without murmurs Abd:  Abdomen is soft, non distended and non tender. No masses are palpable.  There is no rebound and no guarding.  Neurological: Alert and oriented to person, place, and time. Coordination normal.  Skin: Skin is warm and dry. No rash noted. No diaphoretic. No erythema. No pallor.  Psychiatric: Normal mood and affect. Normal behavior. Judgment and thought content normal.    ASSESSMENT: He did have a chest x-ray made today. I have independently reviewed. I see no abnormalities on it. I have explained to the patient that his house and the aorta is mildly dilated. I would like to see him back again in 6 months with a repeat CT scan of the chest. 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   PLAN:   I will see the patient back again in 6 months time with a  repeat CT scan of the chest. All this questions were answered.    Nestor Lewandowsky, MD

## 2016-10-06 NOTE — Patient Instructions (Signed)
We will call you once we schedule your CT Scan with Contrast.

## 2016-10-27 ENCOUNTER — Telehealth: Payer: Self-pay | Admitting: Cardiothoracic Surgery

## 2016-10-27 NOTE — Telephone Encounter (Signed)
Letter written and faxed at this time with positive confirmation to [phone]. AttnFreda Munro

## 2016-10-27 NOTE — Telephone Encounter (Signed)
Patient has called and is requesting a note to be faxed to his employer for him to be on full duty with no restrictions.   Patient was last seen by Dr Genevive Bi on 10/06/16 and does not need to come back till 6 months to have a repeat chest CT scan.   Please fax to [phone] Attention Freda Munro. Please  Call patient once completed at number verified in chart.

## 2017-01-24 LAB — LIPID PANEL
Cholesterol: 179 (ref 0–200)
HDL: 37 (ref 35–70)
LDL Cholesterol: 104
Triglycerides: 190 — AB (ref 40–160)

## 2017-01-24 LAB — PSA: PSA: 0.3

## 2017-04-02 ENCOUNTER — Other Ambulatory Visit: Payer: Self-pay

## 2017-04-02 DIAGNOSIS — I77810 Thoracic aortic ectasia: Secondary | ICD-10-CM | POA: Insufficient documentation

## 2017-04-11 ENCOUNTER — Ambulatory Visit (INDEPENDENT_AMBULATORY_CARE_PROVIDER_SITE_OTHER): Payer: BLUE CROSS/BLUE SHIELD | Admitting: Urology

## 2017-04-11 ENCOUNTER — Encounter: Payer: Self-pay | Admitting: Urology

## 2017-04-11 VITALS — BP 135/81 | HR 60 | Ht 71.5 in | Wt 248.2 lb

## 2017-04-11 DIAGNOSIS — N35010 Post-traumatic urethral stricture, male, meatal: Secondary | ICD-10-CM | POA: Diagnosis not present

## 2017-04-11 LAB — URINALYSIS, COMPLETE
Bilirubin, UA: NEGATIVE
GLUCOSE, UA: NEGATIVE
KETONES UA: NEGATIVE
LEUKOCYTES UA: NEGATIVE
Nitrite, UA: NEGATIVE
PROTEIN UA: NEGATIVE
Specific Gravity, UA: 1.015 (ref 1.005–1.030)
UUROB: 0.2 mg/dL (ref 0.2–1.0)
pH, UA: 7 (ref 5.0–7.5)

## 2017-04-11 MED ORDER — BETAMETHASONE DIPROPIONATE 0.05 % EX CREA: TOPICAL_CREAM | Freq: Two times a day (BID) | CUTANEOUS | 0 refills | Status: AC

## 2017-04-11 NOTE — Progress Notes (Signed)
04/11/2017 12:21 PM   [place] Sink 01/11/1956 [id number]  Referring provider: Karen Kitchens, MD [place], [place] [address]  Chief Complaint  Patient presents with  . Urethral Stricture    HPI: Arthur Koch is a 61 y.o. 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  He underwent either an internal urethrotomy or urethral dilation back in 2008.  He was on intermittent catheterization for a while after the procedure and then discontinued.  He presents today with a several week history of caliber of his urinary stream.  Denies dysuria or gross hematuria.  Denies flank, abdominal, pelvic or scrotal pain.  He also complains of whitish appearing area below his glans that is irritated with intercourse.   PMH: Past Medical History:  Diagnosis Date  . Diverticulitis   . Hypertension     Surgical History: Past Surgical History:  Procedure Laterality Date  . URETHRA SURGERY      Home Medications:  Allergies as of 04/11/2017   No Known Allergies     Medication List       Accurate as of 04/11/17 12:21 PM. Always use your most recent med list.          aspirin EC 81 MG tablet Take 81 mg by mouth daily.   B COMPLEX PO Take by mouth.   co-enzyme Q-10 50 MG capsule Take 50 mg by mouth daily.   Fish Oil 1000 MG Caps Take 1 capsule by mouth 1 day or 1 dose.   lisinopril-hydrochlorothiazide 20-12.5 MG tablet Commonly known as:  PRINZIDE,ZESTORETIC Take 1 tablet by mouth daily.   pantoprazole 40 MG tablet Commonly known as:  PROTONIX Take 40 mg by mouth daily.       Allergies: No Known Allergies  Family History: Family History  Problem Relation Age of Onset  . Prostate cancer Brother   . Parkinson's disease Father     Social History:  reports that he quit smoking about 33 years ago. His smoking use included Cigarettes. He has quit using smokeless tobacco. He reports that he drinks about 2.4 oz of  alcohol per week . He reports that he does not use drugs.  ROS: UROLOGY Frequent Urination?: No Hard to postpone urination?: No Burning/pain with urination?: No Get up at night to urinate?: Yes Leakage of urine?: No Urine stream starts and stops?: No Trouble starting stream?: No Do you have to strain to urinate?: No Blood in urine?: No Urinary tract infection?: No Sexually transmitted disease?: Yes Injury to kidneys or bladder?: No Painful intercourse?: No Weak stream?: No Erection problems?: No Penile pain?: No  Gastrointestinal Nausea?: No Vomiting?: No Indigestion/heartburn?: Yes Diarrhea?: No Constipation?: No  Constitutional Fever: No Night sweats?: No Weight loss?: No Fatigue?: No  Skin Skin rash/lesions?: No Itching?: No  Eyes Blurred vision?: No Double vision?: No  Ears/Nose/Throat Sore throat?: No Sinus problems?: No  Hematologic/Lymphatic Swollen glands?: No Easy bruising?: No  Cardiovascular Leg swelling?: No Chest pain?: No  Respiratory Cough?: No Shortness of breath?: No  Endocrine Excessive thirst?: No  Musculoskeletal Back pain?: No Joint pain?: No  Neurological Headaches?: No Dizziness?: No  Psychologic Depression?: No Anxiety?: No  Physical Exam: BP 135/81 (BP Location: Right Arm, Patient Position: Sitting, Cuff Size: Large)   Pulse 60   Ht 5' 11.5" (1.816 m)   Wt 248 lb 3.2 oz (112.6 kg)   BMI 34.13 kg/m   Constitutional:  Alert and oriented, No acute distress. HEENT: [place]  AT, moist mucus membranes.  Trachea midline, no masses. Cardiovascular: No clubbing, cyanosis, or edema. Respiratory: Normal respiratory effort, no increased work of breathing. GI: Abdomen is soft, nontender, nondistended, no abdominal masses GU: No CVA tenderness.  Penis is circumcised with a narrow meatus.  Thickened whitish skin right dorsal shaft below the corona.  Testes descended bilaterally without masses or tenderness.  Cord/epididymes  palpably normal. Skin: No rashes, bruises or suspicious lesions. Lymph: No cervical or inguinal adenopathy. Neurologic: Grossly intact, no focal deficits, moving all 4 extremities. Psychiatric: Normal mood and affect.  Laboratory Data: Lab Results  Component Value Date   WBC 5.2 08/20/2016   HGB 12.7 (L) 08/20/2016   HCT 36.3 (L) 08/20/2016   MCV 88.2 08/20/2016   PLT 241 08/20/2016    Lab Results  Component Value Date   CREATININE 0.80 08/23/2016    Urinalysis: Dipstick/microscopy negative   Assessment & Plan:   1.  Meatal stenosis Records are not available regarding his previous procedure and location and he does not remember.  Office cystoscopy would be difficult and will schedule meatal dilation, cystoscopy and possible internal urethrotomy in same day surgery.  The procedure was discussed including potential risks of bleeding, infection and the possibility of recurrent stricture/stenosis.  He indicated all questions were answered and desires to proceed.  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  Rx betamethasone sent to pharmacy.    Abbie Sons, [place] [address] [place], [place] [address] [phone]

## 2017-04-11 NOTE — H&P (View-Only) (Signed)
04/11/2017 12:21 PM   [place] Sink 1956-03-14 [id number]  Referring provider: Karen Kitchens, MD [place], [place] [address]  Chief Complaint  Patient presents with  . Urethral Stricture    HPI: Arthur Koch is a 61 y.o. 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  He underwent either an internal urethrotomy or urethral dilation back in 2008.  He was on intermittent catheterization for a while after the procedure and then discontinued.  He presents today with a several week history of caliber of his urinary stream.  Denies dysuria or gross hematuria.  Denies flank, abdominal, pelvic or scrotal pain.  He also complains of whitish appearing area below his glans that is irritated with intercourse.   PMH: Past Medical History:  Diagnosis Date  . Diverticulitis   . Hypertension     Surgical History: Past Surgical History:  Procedure Laterality Date  . URETHRA SURGERY      Home Medications:  Allergies as of 04/11/2017   No Known Allergies     Medication List       Accurate as of 04/11/17 12:21 PM. Always use your most recent med list.          aspirin EC 81 MG tablet Take 81 mg by mouth daily.   B COMPLEX PO Take by mouth.   co-enzyme Q-10 50 MG capsule Take 50 mg by mouth daily.   Fish Oil 1000 MG Caps Take 1 capsule by mouth 1 day or 1 dose.   lisinopril-hydrochlorothiazide 20-12.5 MG tablet Commonly known as:  PRINZIDE,ZESTORETIC Take 1 tablet by mouth daily.   pantoprazole 40 MG tablet Commonly known as:  PROTONIX Take 40 mg by mouth daily.       Allergies: No Known Allergies  Family History: Family History  Problem Relation Age of Onset  . Prostate cancer Brother   . Parkinson's disease Father     Social History:  reports that he quit smoking about 33 years ago. His smoking use included Cigarettes. He has quit using smokeless tobacco. He reports that he drinks about 2.4 oz of  alcohol per week . He reports that he does not use drugs.  ROS: UROLOGY Frequent Urination?: No Hard to postpone urination?: No Burning/pain with urination?: No Get up at night to urinate?: Yes Leakage of urine?: No Urine stream starts and stops?: No Trouble starting stream?: No Do you have to strain to urinate?: No Blood in urine?: No Urinary tract infection?: No Sexually transmitted disease?: Yes Injury to kidneys or bladder?: No Painful intercourse?: No Weak stream?: No Erection problems?: No Penile pain?: No  Gastrointestinal Nausea?: No Vomiting?: No Indigestion/heartburn?: Yes Diarrhea?: No Constipation?: No  Constitutional Fever: No Night sweats?: No Weight loss?: No Fatigue?: No  Skin Skin rash/lesions?: No Itching?: No  Eyes Blurred vision?: No Double vision?: No  Ears/Nose/Throat Sore throat?: No Sinus problems?: No  Hematologic/Lymphatic Swollen glands?: No Easy bruising?: No  Cardiovascular Leg swelling?: No Chest pain?: No  Respiratory Cough?: No Shortness of breath?: No  Endocrine Excessive thirst?: No  Musculoskeletal Back pain?: No Joint pain?: No  Neurological Headaches?: No Dizziness?: No  Psychologic Depression?: No Anxiety?: No  Physical Exam: BP 135/81 (BP Location: Right Arm, Patient Position: Sitting, Cuff Size: Large)   Pulse 60   Ht 5' 11.5" (1.816 m)   Wt 248 lb 3.2 oz (112.6 kg)   BMI 34.13 kg/m   Constitutional:  Alert and oriented, No acute distress. HEENT: [place]  AT, moist mucus membranes.  Trachea midline, no masses. Cardiovascular: No clubbing, cyanosis, or edema. Respiratory: Normal respiratory effort, no increased work of breathing. GI: Abdomen is soft, nontender, nondistended, no abdominal masses GU: No CVA tenderness.  Penis is circumcised with a narrow meatus.  Thickened whitish skin right dorsal shaft below the corona.  Testes descended bilaterally without masses or tenderness.  Cord/epididymes  palpably normal. Skin: No rashes, bruises or suspicious lesions. Lymph: No cervical or inguinal adenopathy. Neurologic: Grossly intact, no focal deficits, moving all 4 extremities. Psychiatric: Normal mood and affect.  Laboratory Data: Lab Results  Component Value Date   WBC 5.2 08/20/2016   HGB 12.7 (L) 08/20/2016   HCT 36.3 (L) 08/20/2016   MCV 88.2 08/20/2016   PLT 241 08/20/2016    Lab Results  Component Value Date   CREATININE 0.80 08/23/2016    Urinalysis: Dipstick/microscopy negative   Assessment & Plan:   1.  Meatal stenosis Records are not available regarding his previous procedure and location and he does not remember.  Office cystoscopy would be difficult and will schedule meatal dilation, cystoscopy and possible internal urethrotomy in same day surgery.  The procedure was discussed including potential risks of bleeding, infection and the possibility of recurrent stricture/stenosis.  He indicated all questions were answered and desires to proceed.  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  Rx betamethasone sent to pharmacy.    Abbie Sons, [place] [address], [place], [place] [address] [phone]

## 2017-04-16 ENCOUNTER — Encounter
Admission: RE | Admit: 2017-04-16 | Discharge: 2017-04-16 | Disposition: A | Discharge status: Home / Self Care | Payer: BLUE CROSS/BLUE SHIELD | Source: Ambulatory Visit | Attending: Urology | Admitting: Urology

## 2017-04-16 HISTORY — DX: Ileus, unspecified: K56.7

## 2017-04-16 HISTORY — DX: Gastro-esophageal reflux disease without esophagitis: K21.9

## 2017-04-16 HISTORY — DX: Fracture of one rib, unspecified side, initial encounter for closed fracture: S22.39XA

## 2017-04-16 NOTE — Patient Instructions (Signed)
Your procedure is scheduled on: 04-20-17 Report to Same Day Surgery 2nd floor medical mall [facility] Entrance-take elevator on left to 2nd floor.  Check in with surgery information desk.) To find out your arrival time please call [phone] between 1PM - 3PM on 04-19-17  Remember: Instructions that are not followed completely may result in serious medical risk, up to and including death, or upon the discretion of your surgeon and anesthesiologist your surgery may need to be rescheduled.    _x___ 1. Do not eat food after midnight the night before your procedure. NO GUM CHEWING OR CANDY AFTER MIDNIGHT.  You may drink clear liquids up to 2 hours before you are scheduled to arrive at the hospital for your procedure.  Do not drink clear liquids within 2 hours of your scheduled arrival to the hospital.  Clear liquids include  --Water or Apple juice without pulp  --Clear carbohydrate beverage such as ClearFast or Gatorade  --Black Coffee or Clear Tea (No milk, no creamers, do not add anything to  the coffee or Tea)     __x__ 2. No Alcohol for 24 hours before or after surgery.   __x__3. No Smoking for 24 prior to surgery.   ____  4. Bring all medications with you on the day of surgery if instructed.    __x__ 5. Notify your doctor if there is any change in your medical condition     (cold, fever, infections).     Do not wear jewelry, make-up, hairpins, clips or nail polish.  Do not wear lotions, powders, or perfumes. You may wear deodorant.  Do not shave 48 hours prior to surgery. Men may shave face and neck.  Do not bring valuables to the hospital.    [facility] is not responsible for any belongings or valuables.               Contacts, dentures or bridgework may not be worn into surgery.  Leave your suitcase in the car. After surgery it may be brought to your room.  For patients admitted to the hospital, discharge time is determined by your  treatment team.   Patients discharged  the day of surgery will not be allowed to drive home.  You will need someone to drive you home and stay with you the night of your procedure.    Please read over the following fact sheets that you were given:     _x___ [place] WITH A SMALL SIP OF WATER. These include:  1. PROTONIX  2. TAKE AN EXTRA PROTONIX AT BEDTIME ON Thursday NIGHT  3.  4.  5.  6.  ____Fleets enema or Magnesium Citrate as directed.   ____ Use CHG Soap or sage wipes as directed on instruction sheet   ____ Use inhalers on the day of surgery and bring to hospital day of surgery  ____ Stop Metformin and Janumet 2 days prior to surgery.    ____ Take 1/2 of usual insulin dose the night before surgery and none on the morning     surgery.   _x___ Follow recommendations from Cardiologist, Pulmonologist or PCP regarding stopping Aspirin, Coumadin, Plavix ,Eliquis, Effient, or Pradaxa, and Pletal-STIOP ASPIRIN 7 DAYS PRIOR TO SURGERY  X____Stop Anti-inflammatories such as Advil, Aleve, IBUPROFEN, Motrin, Naproxen, Naprosyn, Goodies powders or aspirin products NOW-OK to take Tylenol   _x___ Stop supplements until after surgery-STOP CO-Q 10 AND FISH OIL NOW-MAY RESUME AFTER SURGERY   ____  Bring C-Pap to the hospital.

## 2017-04-18 ENCOUNTER — Encounter: Payer: Self-pay | Admitting: Urology

## 2017-04-24 ENCOUNTER — Ambulatory Visit: Payer: BLUE CROSS/BLUE SHIELD

## 2017-04-25 ENCOUNTER — Encounter
Admission: RE | Admit: 2017-04-25 | Discharge: 2017-04-25 | Disposition: A | Discharge status: Home / Self Care | Payer: BLUE CROSS/BLUE SHIELD | Source: Ambulatory Visit | Attending: Urology | Admitting: Urology

## 2017-04-25 DIAGNOSIS — Z7982 Long term (current) use of aspirin: Secondary | ICD-10-CM | POA: Diagnosis not present

## 2017-04-25 DIAGNOSIS — Z87891 Personal history of nicotine dependence: Secondary | ICD-10-CM | POA: Diagnosis not present

## 2017-04-25 DIAGNOSIS — I10 Essential (primary) hypertension: Secondary | ICD-10-CM | POA: Diagnosis not present

## 2017-04-25 DIAGNOSIS — K219 Gastro-esophageal reflux disease without esophagitis: Secondary | ICD-10-CM | POA: Diagnosis not present

## 2017-04-25 DIAGNOSIS — N35911 Unspecified urethral stricture, male, meatal: Secondary | ICD-10-CM | POA: Diagnosis present

## 2017-04-25 LAB — POTASSIUM: Potassium: 4 mmol/L (ref 3.5–5.1)

## 2017-04-25 LAB — URINALYSIS, ROUTINE W REFLEX MICROSCOPIC
Bilirubin Urine: NEGATIVE
Glucose, UA: NEGATIVE mg/dL
Hgb urine dipstick: NEGATIVE
KETONES UR: NEGATIVE mg/dL
LEUKOCYTES UA: NEGATIVE
NITRITE: NEGATIVE
PROTEIN: NEGATIVE mg/dL
Specific Gravity, Urine: 1.013 (ref 1.005–1.030)
pH: 6 (ref 5.0–8.0)

## 2017-04-26 MED ORDER — FAMOTIDINE 20 MG PO TABS: 20.0000 mg | ORAL_TABLET | Freq: Once | ORAL | Status: DC

## 2017-04-26 MED ORDER — CEFAZOLIN SODIUM-DEXTROSE 2-4 GM/100ML-% IV SOLN
2.0000 g | Freq: Once | INTRAVENOUS | Status: AC
  Administered 2017-04-27: 2 g via INTRAVENOUS

## 2017-04-27 ENCOUNTER — Ambulatory Visit: Payer: BLUE CROSS/BLUE SHIELD | Admitting: Anesthesiology

## 2017-04-27 ENCOUNTER — Other Ambulatory Visit: Payer: Self-pay

## 2017-04-27 ENCOUNTER — Ambulatory Visit: Payer: Self-pay | Admitting: Cardiothoracic Surgery

## 2017-04-27 ENCOUNTER — Ambulatory Visit
Admission: RE | Admit: 2017-04-27 | Discharge: 2017-04-27 | Disposition: A | Discharge status: Home / Self Care | Payer: BLUE CROSS/BLUE SHIELD | Source: Ambulatory Visit | Attending: Urology | Admitting: Urology

## 2017-04-27 ENCOUNTER — Encounter: Payer: Self-pay | Admitting: *Deleted

## 2017-04-27 ENCOUNTER — Encounter
Admission: RE | Disposition: A | Discharge status: Home / Self Care | Payer: Self-pay | Source: Ambulatory Visit | Attending: Urology

## 2017-04-27 DIAGNOSIS — Z87891 Personal history of nicotine dependence: Secondary | ICD-10-CM | POA: Insufficient documentation

## 2017-04-27 DIAGNOSIS — N35811 Other urethral stricture, male, meatal: Secondary | ICD-10-CM

## 2017-04-27 DIAGNOSIS — N35911 Unspecified urethral stricture, male, meatal: Secondary | ICD-10-CM | POA: Insufficient documentation

## 2017-04-27 DIAGNOSIS — I10 Essential (primary) hypertension: Secondary | ICD-10-CM | POA: Insufficient documentation

## 2017-04-27 DIAGNOSIS — K219 Gastro-esophageal reflux disease without esophagitis: Secondary | ICD-10-CM | POA: Insufficient documentation

## 2017-04-27 DIAGNOSIS — D408 Neoplasm of uncertain behavior of other specified male genital organs: Secondary | ICD-10-CM | POA: Diagnosis not present

## 2017-04-27 DIAGNOSIS — Z7982 Long term (current) use of aspirin: Secondary | ICD-10-CM | POA: Insufficient documentation

## 2017-04-27 HISTORY — PX: CYSTOSCOPY WITH URETHRAL DILATATION: SHX5125

## 2017-04-27 HISTORY — PX: PENILE BIOPSY: SHX6013

## 2017-04-27 LAB — URINE CULTURE

## 2017-04-27 SURGERY — CYSTOSCOPY, WITH URETHRAL DILATION
Anesthesia: General | Wound class: Clean Contaminated

## 2017-04-27 MED ORDER — FENTANYL CITRATE (PF) 100 MCG/2ML IJ SOLN
INTRAMUSCULAR | Status: AC
  Filled 2017-04-27: qty 2

## 2017-04-27 MED ORDER — LIDOCAINE HCL (PF) 2 % IJ SOLN
INTRAMUSCULAR | Status: AC
  Filled 2017-04-27: qty 10

## 2017-04-27 MED ORDER — FENTANYL CITRATE (PF) 100 MCG/2ML IJ SOLN
INTRAMUSCULAR | Status: DC | PRN
  Administered 2017-04-27: 50 ug via INTRAVENOUS

## 2017-04-27 MED ORDER — PROMETHAZINE HCL 25 MG/ML IJ SOLN: 6.2500 mg | INTRAMUSCULAR | Status: DC | PRN

## 2017-04-27 MED ORDER — PROPOFOL 10 MG/ML IV BOLUS
INTRAVENOUS | Status: DC | PRN
  Administered 2017-04-27: 200 mg via INTRAVENOUS

## 2017-04-27 MED ORDER — LACTATED RINGERS IV SOLN
INTRAVENOUS | Status: DC
  Administered 2017-04-27: 09:00:00 via INTRAVENOUS

## 2017-04-27 MED ORDER — FENTANYL CITRATE (PF) 100 MCG/2ML IJ SOLN: 25.0000 ug | INTRAMUSCULAR | Status: DC | PRN

## 2017-04-27 MED ORDER — DEXAMETHASONE SODIUM PHOSPHATE 10 MG/ML IJ SOLN
INTRAMUSCULAR | Status: DC | PRN
  Administered 2017-04-27: 10 mg via INTRAVENOUS

## 2017-04-27 MED ORDER — ONDANSETRON HCL 4 MG/2ML IJ SOLN
INTRAMUSCULAR | Status: AC
  Filled 2017-04-27: qty 2

## 2017-04-27 MED ORDER — LIDOCAINE HCL (CARDIAC) 20 MG/ML IV SOLN
INTRAVENOUS | Status: DC | PRN
  Administered 2017-04-27: 100 mg via INTRAVENOUS

## 2017-04-27 MED ORDER — CEFAZOLIN SODIUM-DEXTROSE 2-4 GM/100ML-% IV SOLN
INTRAVENOUS | Status: AC
  Filled 2017-04-27: qty 100

## 2017-04-27 MED ORDER — BACITRACIN ZINC 500 UNIT/GM EX OINT
TOPICAL_OINTMENT | CUTANEOUS | Status: AC
  Filled 2017-04-27: qty 28.35

## 2017-04-27 MED ORDER — MIDAZOLAM HCL 2 MG/2ML IJ SOLN
INTRAMUSCULAR | Status: DC | PRN
  Administered 2017-04-27: 2 mg via INTRAVENOUS

## 2017-04-27 MED ORDER — DEXAMETHASONE SODIUM PHOSPHATE 10 MG/ML IJ SOLN
INTRAMUSCULAR | Status: AC
  Filled 2017-04-27: qty 1

## 2017-04-27 MED ORDER — PROPOFOL 10 MG/ML IV BOLUS
INTRAVENOUS | Status: AC
  Filled 2017-04-27: qty 20

## 2017-04-27 MED ORDER — ONDANSETRON HCL 4 MG/2ML IJ SOLN
INTRAMUSCULAR | Status: DC | PRN
  Administered 2017-04-27: 4 mg via INTRAVENOUS

## 2017-04-27 MED ORDER — MIDAZOLAM HCL 2 MG/2ML IJ SOLN
INTRAMUSCULAR | Status: AC
  Filled 2017-04-27: qty 2

## 2017-04-27 MED ORDER — PHENYLEPHRINE HCL 10 MG/ML IJ SOLN
INTRAMUSCULAR | Status: DC | PRN
  Administered 2017-04-27: 100 ug via INTRAVENOUS

## 2017-04-27 MED ORDER — HYDROCODONE-ACETAMINOPHEN 5-325 MG PO TABS: 1.0000 | ORAL_TABLET | ORAL | 0 refills | Status: DC | PRN

## 2017-04-27 SURGICAL SUPPLY — 25 items
BAG DRAIN CYSTO-URO LG1000N (MISCELLANEOUS) ×2 IMPLANT
BAG URO DRAIN 2000ML W/SPOUT (MISCELLANEOUS) ×2 IMPLANT
BASIN GRAD PLASTIC 32OZ STRL (MISCELLANEOUS) IMPLANT
CATH FOL 2WAY LX 16X5 (CATHETERS) IMPLANT
CATH FOLEY 2W COUNCIL 20FR 5CC (CATHETERS) IMPLANT
CATH SET URETHRAL DILATOR (CATHETERS) IMPLANT
CONRAY 43 FOR UROLOGY 50M (MISCELLANEOUS) IMPLANT
COUNTER NEEDLE 20/40 LG (NEEDLE) ×2 IMPLANT
DRAPE SHEET LG 3/4 BI-LAMINATE (DRAPES) IMPLANT
DRSG TELFA 4X3 1S NADH ST (GAUZE/BANDAGES/DRESSINGS) ×2 IMPLANT
ELECT REM PT RETURN 9FT ADLT (ELECTROSURGICAL) ×2
ELECTRODE REM PT RTRN 9FT ADLT (ELECTROSURGICAL) ×1 IMPLANT
GLOVE BIOGEL M 8.0 STRL (GLOVE) ×2 IMPLANT
GOWN STANDARD XL  REUSABL (MISCELLANEOUS) ×4 IMPLANT
KIT RM TURNOVER CYSTO AR (KITS) ×2 IMPLANT
PACK CYSTO AR (MISCELLANEOUS) ×2 IMPLANT
SET CYSTO W/LG BORE CLAMP LF (SET/KITS/TRAYS/PACK) ×2 IMPLANT
SOL PREP PVP 2OZ (MISCELLANEOUS) ×2
SOLUTION PREP PVP 2OZ (MISCELLANEOUS) ×1 IMPLANT
SURGILUBE 2OZ TUBE FLIPTOP (MISCELLANEOUS) ×2 IMPLANT
SUT CHROMIC 4 0 SH 27 (SUTURE) ×2 IMPLANT
SYR 30ML LL (SYRINGE) IMPLANT
SYRINGE IRR TOOMEY STRL 70CC (SYRINGE) ×2 IMPLANT
WATER STERILE IRR 1000ML POUR (IV SOLUTION) ×2 IMPLANT
WATER STERILE IRR 3000ML UROMA (IV SOLUTION) ×2 IMPLANT

## 2017-04-27 NOTE — Anesthesia Preprocedure Evaluation (Signed)
Anesthesia Evaluation  Patient identified by MRN, date of birth, ID band Patient awake    Reviewed: Allergy & Precautions, H&P , NPO status , Patient's Chart, lab work & pertinent test results, reviewed documented beta blocker date and time   History of Anesthesia Complications Negative for: history of anesthetic complications  Airway Mallampati: I  TM Distance: >3 FB Neck ROM: full    Dental  (+) Caps, Dental Advidsory Given, Missing, Teeth Intact   Pulmonary neg pulmonary ROS, former smoker,           Cardiovascular Exercise Tolerance: Good hypertension, (-) angina+ Peripheral Vascular Disease  (-) CAD, (-) Past MI, (-) Cardiac Stents and (-) CABG (-) dysrhythmias (-) Valvular Problems/Murmurs     Neuro/Psych negative neurological ROS  negative psych ROS   GI/Hepatic Neg liver ROS, GERD  ,  Endo/Other  negative endocrine ROS  Renal/GU negative Renal ROS  negative genitourinary   Musculoskeletal   Abdominal   Peds  Hematology negative hematology ROS (+)   Anesthesia Other Findings Past Medical History: No date: Diverticulitis No date: GERD (gastroesophageal reflux disease) No date: Hypertension No date: Ileus ([place]) 08/2016: Rib fractures     Comment:  FX 8 RIBS-FELL OFF A LADDER   Reproductive/Obstetrics negative OB ROS                             Anesthesia Physical Anesthesia Plan  ASA: II  Anesthesia Plan: General   Post-op Pain Management:    Induction: Intravenous  PONV Risk Score and Plan: 2 and Ondansetron and Dexamethasone  Airway Management Planned: LMA  Additional Equipment:   Intra-op Plan:   Post-operative Plan: Extubation in OR  Informed Consent: I have reviewed the patients History and Physical, chart, labs and discussed the procedure including the risks, benefits and alternatives for the proposed anesthesia with the patient or authorized representative  who has indicated his/her understanding and acceptance.   Dental Advisory Given  Plan Discussed with: Anesthesiologist, CRNA and Surgeon  Anesthesia Plan Comments:         Anesthesia Quick Evaluation

## 2017-04-27 NOTE — Transfer of Care (Signed)
Immediate Anesthesia Transfer of Care Note  Patient: Arthur Koch  Procedure(s) Performed: CYSTOSCOPY WITH MEATAL DILATION (N/A ) PENILE BIOPSY (N/A )  Patient Location: PACU  Anesthesia Type:General  Level of Consciousness: drowsy and patient cooperative  Airway & Oxygen Therapy: Patient Spontanous Breathing and Patient connected to face mask oxygen  Post-op Assessment: Report given to RN and Post -op Vital signs reviewed and stable  Post vital signs: Reviewed and stable  Last Vitals:  Vitals:   04/27/17 0755 04/27/17 1008  BP: 138/89 116/78  Pulse: 70 63  Resp: 14   Temp: 36.5 C 36.6 C  SpO2: 98% 95%    Last Pain:  Vitals:   04/27/17 0755  TempSrc: Oral         Complications: No apparent anesthesia complications

## 2017-04-27 NOTE — Anesthesia Procedure Notes (Signed)
Procedure Name: LMA Insertion Date/Time: 04/27/2017 9:12 AM Performed by: Silvana Newness, CRNA Pre-anesthesia Checklist: Patient identified, Emergency Drugs available, Suction available, Patient being monitored and Timeout performed Patient Re-evaluated:Patient Re-evaluated prior to induction Oxygen Delivery Method: Circle system utilized Preoxygenation: Pre-oxygenation with 100% oxygen Induction Type: IV induction Ventilation: Mask ventilation without difficulty LMA: LMA inserted LMA Size: 4.0 Number of attempts: 1 Placement Confirmation: positive ETCO2 and breath sounds checked- equal and bilateral Tube secured with: Tape Dental Injury: Teeth and Oropharynx as per pre-operative assessment

## 2017-04-27 NOTE — Anesthesia Post-op Follow-up Note (Signed)
Anesthesia QCDR form completed.        

## 2017-04-27 NOTE — Interval H&P Note (Signed)
History and Physical Interval Note:  04/27/2017 8:52 AM  Arthur Koch  has presented today for surgery, with the diagnosis of URETHRAL STRICTURE  The various methods of treatment have been discussed with the patient and family. After consideration of risks, benefits and other options for treatment, the patient has consented to  Procedure(s): CYSTOSCOPY WITH MEATAL DILATION (N/A) PENILE BIOPSY (N/A) CYSTOSCOPY WITH DIRECT VISION INTERNAL URETHROTOMY (N/A) as a surgical intervention .  The patient's history has been reviewed, patient examined, no change in status, stable for surgery.  I have reviewed the patient's chart and labs.  Questions were answered to the patient's satisfaction.     [place]

## 2017-04-27 NOTE — Discharge Instructions (Signed)

## 2017-04-30 LAB — SURGICAL PATHOLOGY

## 2017-04-30 NOTE — Anesthesia Postprocedure Evaluation (Signed)
Anesthesia Post Note  Patient: Arthur Koch  Procedure(s) Performed: CYSTOSCOPY WITH MEATAL DILATION (N/A ) PENILE BIOPSY (N/A )  Patient location during evaluation: PACU Anesthesia Type: General Level of consciousness: awake and alert Pain management: pain level controlled Vital Signs Assessment: post-procedure vital signs reviewed and stable Respiratory status: spontaneous breathing, nonlabored ventilation, respiratory function stable and patient connected to nasal cannula oxygen Cardiovascular status: blood pressure returned to baseline and stable Postop Assessment: no apparent nausea or vomiting Anesthetic complications: no     Last Vitals:  Vitals:   04/27/17 1043 04/27/17 1110  BP: (!) 137/92 126/79  Pulse: (!) 58 (!) 55  Resp:    Temp: (!) 36.1 C   SpO2: 100% 100%    Last Pain:  Vitals:   04/27/17 1043  TempSrc:   PainSc: 0-No pain                 Martha Clan

## 2017-04-30 NOTE — Op Note (Signed)
Date of procedure: 04/30/17  Preoperative diagnosis:  1. Urethral stricture 2. Penile lesion  Postoperative diagnosis:  1. Meatal stenosis 2. Penile lesion  Procedure: 1. Urethral dilation 2. Penile biopsy  Surgeon: John Giovanni, MD  Anesthesia: General  Complications: None  Intraoperative findings: Meatal stenosis.  Penile skin changes suspicious for BXO  EBL: Minimal  Specimens: Penile biopsy  Drains: None  Indication: Arthur Koch is a 61 y.o. patient with a history of urethral stricture disease.  He presented with a several week history of decreased force and caliber of his urinary stream.  Exam was consistent with meatal stenosis and findings suspicious for balanitis xerotica obliterans.  After reviewing the management options for treatment, he elected to proceed with the above surgical procedure(s). We have discussed the potential benefits and risks of the procedure, side effects of the proposed treatment, the likelihood of the patient achieving the goals of the procedure, and any potential problems that might occur during the procedure or recuperation. Informed consent has been obtained.  Description of procedure:  The patient was taken to the operating room and general anesthesia was induced.  The patient was placed in the dorsal lithotomy position, prepped and draped in the usual sterile fashion, and preoperative antibiotics were administered. A preoperative time-out was performed.   The urethral meatus was small and was able to accept a [place] sound.  The meatus was sequentially dilated up to 22 [place] without difficulty.  A 21 French cystoscope was lubricated and passed under direct vision.  The remainder of the urethra was normal in appearance.  There was a wide caliber bulbar stricture present which the 21 French cystoscope passed without difficulty.  There was minimal lateral lobe enlargement and mild bladder neck elevation.  The bladder mucosa was  closely inspected and no mucosal abnormalities were noted including erythema, solid or papillary lesions.  The bladder was emptied and the cystoscope was removed.  Attention was then directed to an area of whitish tissue below the corona radiata on the right distal penile shaft.  A small ellipse of skin was excised.  The site was closed with interrupted 4-0 chromic suture and bacitracin ointment was placed.  After reversal of anesthesia he was taken to PACU in stable condition.  There were no complications. The  John Giovanni, M.D.

## 2017-05-07 ENCOUNTER — Telehealth: Payer: Self-pay

## 2017-05-07 NOTE — Telephone Encounter (Signed)
-----   Message from Abbie Sons, MD sent at 05/06/2017  9:51 AM EST ----- Penile biopsy showed inflammatory changes and no evidence of cancer

## 2017-05-07 NOTE — Telephone Encounter (Signed)
Will send a letter

## 2017-05-08 ENCOUNTER — Ambulatory Visit
Admission: RE | Admit: 2017-05-08 | Discharge: 2017-05-08 | Disposition: A | Discharge status: Home / Self Care | Payer: BLUE CROSS/BLUE SHIELD | Source: Ambulatory Visit | Attending: Cardiothoracic Surgery | Admitting: Cardiothoracic Surgery

## 2017-05-08 DIAGNOSIS — I77810 Thoracic aortic ectasia: Secondary | ICD-10-CM | POA: Insufficient documentation

## 2017-05-08 DIAGNOSIS — I2510 Atherosclerotic heart disease of native coronary artery without angina pectoris: Secondary | ICD-10-CM | POA: Insufficient documentation

## 2017-05-08 LAB — POCT I-STAT CREATININE: Creatinine, Ser: 1.1 mg/dL (ref 0.61–1.24)

## 2017-05-08 MED ORDER — IOPAMIDOL (ISOVUE-300) INJECTION 61%
75.0000 mL | Freq: Once | INTRAVENOUS | Status: AC | PRN
Start: 2017-05-08 — End: 2017-05-08
  Administered 2017-05-08: 75 mL via INTRAVENOUS

## 2017-05-11 ENCOUNTER — Ambulatory Visit (INDEPENDENT_AMBULATORY_CARE_PROVIDER_SITE_OTHER): Payer: BLUE CROSS/BLUE SHIELD | Admitting: Cardiothoracic Surgery

## 2017-05-11 ENCOUNTER — Encounter: Payer: Self-pay | Admitting: Urology

## 2017-05-11 ENCOUNTER — Encounter: Payer: Self-pay | Admitting: Cardiothoracic Surgery

## 2017-05-11 ENCOUNTER — Ambulatory Visit (INDEPENDENT_AMBULATORY_CARE_PROVIDER_SITE_OTHER): Payer: BLUE CROSS/BLUE SHIELD | Admitting: Urology

## 2017-05-11 VITALS — BP 138/72 | HR 76 | Ht 71.0 in | Wt 248.5 lb

## 2017-05-11 VITALS — BP 144/73 | HR 76 | Temp 97.6°F | Resp 18 | Ht 71.0 in | Wt 252.0 lb

## 2017-05-11 DIAGNOSIS — N35010 Post-traumatic urethral stricture, male, meatal: Secondary | ICD-10-CM

## 2017-05-11 DIAGNOSIS — I712 Thoracic aortic aneurysm, without rupture, unspecified: Secondary | ICD-10-CM

## 2017-05-11 HISTORY — DX: Thoracic aortic aneurysm, without rupture, unspecified: I71.20

## 2017-05-11 HISTORY — DX: Thoracic aortic aneurysm, without rupture: I71.2

## 2017-05-11 NOTE — Progress Notes (Signed)
  Patient ID: Arthur Koch, male   DOB: [birth year], 61 y.o.   MRN: [id number]  HISTORY: Mr. Arthur Koch returns today in follow-up.  He did have a CT scan done today.  He states he has been doing very well without any significant problems.  He is been having his blood pressure checked by his physician at his work.  He has been parents describing his antihypertensive medications and he has been reliable in taking them.  He is not diabetic and does not smoke.  He states he had his cholesterol checked recently and it was in the normal range.   Vitals:   05/11/17 0933  BP: (!) 144/73  Pulse: 76  Resp: 18  Temp: 97.6 F (36.4 C)  SpO2: 96%     EXAM:    Resp: Lungs are clear bilaterally.  No respiratory distress, normal effort. Heart:  Regular without murmurs Abd:  Abdomen is soft, non distended and non tender. No masses are palpable.  There is no rebound and no guarding.  Neurological: Alert and oriented to person, place, and time. Coordination normal.  Skin: Skin is warm and dry. No rash noted. No diaphoretic. No erythema. No pallor.  Psychiatric: Normal mood and affect. Normal behavior. Judgment and thought content normal.    ASSESSMENT: I have independently reviewed the patient's CTA from today.  The ascending aorta is unchanged and measures approximately 4.4 cm.  There are no lung nodules or other abnormalities detected.   PLAN:   I had a long discussion with him regarding the need for continued follow-up.  He is quite concerned about the cost involved.  I have asked our office staff to help him obtain the information regarding a CT scan versus an MRI with and without contrast.  At the present time the patient will follow up with his physician at work.  We will work with him to set him up to come back in 1 year.  I did explain to him that it was our recommendation that he have his aorta measured annually.  He understands that but may choose to not have that done based upon the  cost.    Arthur Lewandowsky, MD

## 2017-05-11 NOTE — Patient Instructions (Signed)
We will schedule an MRI or CT scan and a follow up appointment with Dr.Oaks in 1 year.

## 2017-05-16 DIAGNOSIS — N35811 Other urethral stricture, male, meatal: Secondary | ICD-10-CM | POA: Insufficient documentation

## 2017-05-16 DIAGNOSIS — N35010 Post-traumatic urethral stricture, male, meatal: Secondary | ICD-10-CM | POA: Insufficient documentation

## 2017-05-16 NOTE — Progress Notes (Signed)
05/11/2017 8:11 AM   [place] S[place] [birth year] [id number]  Referring provider: Karen Kitchens, MD [place], [place] [address]  Chief Complaint  Patient presents with  . Follow-up    HPI: 61 y.o. male with a history of a urethral stricture disease.  He underwent cystoscopy under anesthesia and penile biopsy for an area suspicious for BXO on 04/27/2017.  Intraoperative findings remarkable only for a meatal stricture.  The pathology report on penile biopsy showed no BXO, carcinoma or CIS and return consistent with chronic inflammatory changes.   PMH: Past Medical History:  Diagnosis Date  . Diverticulitis   . GERD (gastroesophageal reflux disease)   . Hypertension   . Ileus ([place])   . Rib fractures 08/2016   FX 8 RIBS-FELL OFF A LADDER    Surgical History: Past Surgical History:  Procedure Laterality Date  . CYSTOSCOPY WITH URETHRAL DILATATION N/A 04/27/2017   Procedure: CYSTOSCOPY WITH MEATAL DILATION;  Surgeon: Abbie Sons, MD;  Location: [facility] ORS;  Service: Urology;  Laterality: N/A;  . PENILE BIOPSY N/A 04/27/2017   Procedure: PENILE BIOPSY;  Surgeon: Abbie Sons, MD;  Location: [facility] ORS;  Service: Urology;  Laterality: N/A;  . URETHRA SURGERY      Home Medications:  Allergies as of 05/11/2017   No Known Allergies     Medication List        Accurate as of 05/11/17 11:59 PM. Always use your most recent med list.          aspirin EC 81 MG tablet Take 81 mg by mouth daily.   B COMPLEX PO Take 1 tablet daily by mouth. CURRENTLY OUT   co-enzyme Q-10 50 MG capsule Take 50 mg daily by mouth. CURRENTLY OUT   Fish Oil 1000 MG Caps Take 1,000 mg daily by mouth. CURRENTLY OUT   ibuprofen 200 MG tablet Commonly known as:  ADVIL,MOTRIN Take 600 mg by mouth every 6 (six) hours as needed for headache or moderate pain.   lisinopril-hydrochlorothiazide 20-12.5 MG tablet Commonly known as:  PRINZIDE,ZESTORETIC Take 1 tablet by mouth  daily.   pantoprazole 40 MG tablet Commonly known as:  PROTONIX Take 40 mg daily after lunch by mouth.       Allergies: No Known Allergies  Family History: Family History  Problem Relation Age of Onset  . Prostate cancer Brother   . Parkinson's disease Father     Social History:  reports that he quit smoking about 33 years ago. His smoking use included cigarettes. He has a 1.00 pack-year smoking history. He has quit using smokeless tobacco. He reports that he drinks about 2.4 oz of alcohol per week. He reports that he does not use drugs.  ROS: UROLOGY Frequent Urination?: No Hard to postpone urination?: No Burning/pain with urination?: No Get up at night to urinate?: No Leakage of urine?: No Urine stream starts and stops?: No Trouble starting stream?: No Do you have to strain to urinate?: No Blood in urine?: No Urinary tract infection?: No Sexually transmitted disease?: No Injury to kidneys or bladder?: No Painful intercourse?: No Weak stream?: No Erection problems?: No Penile pain?: No  Gastrointestinal Nausea?: No Vomiting?: No Indigestion/heartburn?: No Diarrhea?: No Constipation?: No  Constitutional Fever: No Night sweats?: No Weight loss?: No Fatigue?: No  Skin Skin rash/lesions?: No Itching?: No  Eyes Blurred vision?: No Double vision?: No  Ears/Nose/Throat Sore throat?: No Sinus problems?: No  Hematologic/Lymphatic Swollen glands?: No Easy bruising?: No  Cardiovascular Leg swelling?:  No Chest pain?: No  Respiratory Cough?: No Shortness of breath?: No  Endocrine Excessive thirst?: No  Musculoskeletal Back pain?: No Joint pain?: No  Neurological Headaches?: No Dizziness?: No  Psychologic Depression?: No Anxiety?: No  Physical Exam: BP 138/72 (BP Location: Right Arm, Patient Position: Sitting, Cuff Size: Large)   Pulse 76   Ht 5\' 11"  (1.803 m)   Wt 248 lb 8 oz (112.7 kg)   BMI 34.66 kg/m   Constitutional:  Alert and  oriented, No acute distress. HEENT: [place] AT, moist mucus membranes.  Trachea midline, no masses. Cardiovascular: No clubbing, cyanosis, or edema. Respiratory: Normal respiratory effort, no increased work of breathing. GI: Abdomen is soft, nontender, nondistended, no abdominal masses GU: No CVA tenderness.  Skin: No rashes, bruises or suspicious lesions. Lymph: No cervical or inguinal adenopathy. Neurologic: Grossly intact, no focal deficits, moving all 4 extremities. Psychiatric: Normal mood and affect.  Laboratory Data: Lab Results  Component Value Date   WBC 5.2 08/20/2016   HGB 12.7 (L) 08/20/2016   HCT 36.3 (L) 08/20/2016   MCV 88.2 08/20/2016   PLT 241 08/20/2016    Lab Results  Component Value Date   CREATININE 1.10 05/08/2017     Assessment & Plan:   1. Stricture of urethral meatus  We discussed the likelihood of recurrent meatal stenosis and he was given an Rx for a meatal dilator to perform daily times 2 weeks; 3 times weekly for 4 weeks then weekly and as needed.  Follow-up 3 months.    Return in about 3 months (around 08/09/2017).  Abbie Sons, [place] [address] [place], [place] [address] [phone]

## 2017-05-24 ENCOUNTER — Ambulatory Visit: Payer: Self-pay | Admitting: Podiatry

## 2017-05-29 ENCOUNTER — Ambulatory Visit (INDEPENDENT_AMBULATORY_CARE_PROVIDER_SITE_OTHER): Payer: BLUE CROSS/BLUE SHIELD

## 2017-05-29 ENCOUNTER — Ambulatory Visit (INDEPENDENT_AMBULATORY_CARE_PROVIDER_SITE_OTHER): Payer: BLUE CROSS/BLUE SHIELD | Admitting: Podiatry

## 2017-05-29 DIAGNOSIS — G5761 Lesion of plantar nerve, right lower limb: Secondary | ICD-10-CM

## 2017-05-29 DIAGNOSIS — M7751 Other enthesopathy of right foot: Secondary | ICD-10-CM

## 2017-05-29 DIAGNOSIS — M779 Enthesopathy, unspecified: Secondary | ICD-10-CM

## 2017-05-29 NOTE — Progress Notes (Signed)
   Subjective:    Patient ID: Arthur Koch, male    DOB: 06/30/1955, 61 y.o.   MRN: [id number]  HPI    Review of Systems     Objective:   Physical Exam        Assessment & Plan:

## 2017-05-31 NOTE — Progress Notes (Signed)
   HPI: 61 year old male presenting today as a new patient with a complaint of aching pain to the right foot that began 2-3 months ago.  He states it feels as if he is walking on rocks.  He reports associated numbness in the toes.  Walking and standing increase the pain.  He has been wearing insoles which help provide some relief.  He has not done anything else for treatment.  Patient is here for further evaluation and treatment.   Past Medical History:  Diagnosis Date  . Diverticulitis   . GERD (gastroesophageal reflux disease)   . Hypertension   . Ileus ([place])   . Rib fractures 08/2016   FX 8 RIBS-FELL OFF A LADDER      Physical Exam: General: The patient is alert and oriented x3 in no acute distress.  Dermatology: Skin is warm, dry and supple bilateral lower extremities. Negative for open lesions or macerations.  Vascular: Palpable pedal pulses bilaterally. No edema or erythema noted. Capillary refill within normal limits.  Neurological: Epicritic and protective threshold grossly intact bilaterally.   Musculoskeletal Exam: Sharp pain with palpation of the second interspace and lateral compression of the metatarsal heads consistent with neuroma.  Positive [place] Canal sign with loadbearing of the forefoot.  Radiographic Exam:  Normal osseous mineralization. Joint spaces preserved. No fracture/dislocation/boney destruction.    Assessment: 1.  Morton's neuroma second interspace right foot   Plan of Care:  1. Patient was evaluated.  X-rays reviewed. 2. Injection of 0.5 mLs Celestone Soluspan injected into the neuroma of the second interspace of the right foot. 3.  Met pads dispensed. 4.  Continue wearing OTC insoles. 5.  Return to clinic as needed.   Edrick Kins, DPM [facility]  Dr. Edrick Kins, [address]                                        [place], [place] [address]                Office [phone]  Fax [phone]

## 2017-08-09 ENCOUNTER — Ambulatory Visit: Payer: BLUE CROSS/BLUE SHIELD | Admitting: Urology

## 2018-01-31 ENCOUNTER — Telehealth: Payer: Self-pay

## 2018-01-31 NOTE — Telephone Encounter (Signed)
Spoke with patient and he is undecided as to having MRI or CT chest. He is going to call insurance for quote and give [place] a call back to let us know if he wishes to move forward.

## 2018-02-18 IMAGING — CT CT HEAD W/O CM
3 series · 15 of 47 positions shown, 18 images · non-contrast
Comparison: None.

CLINICAL DATA: Fell about 8ft off ladder, landed flat on his back.
Mid>lower back pain without leg pain or tingling.No previous injury
to lower back

EXAM:
CT HEAD WITHOUT CONTRAST
TECHNIQUE: Contiguous axial images were obtained from the base of the skull
through the vertex without intravenous contrast.

[Series 2: head wo · axial · 0.41mm/px · z∈[-148,-23]mm · 9 of 31 slices shown, 12 images]
[im 3/31  brain]
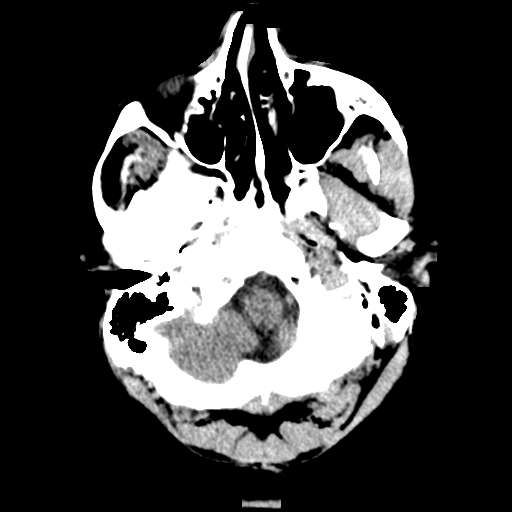
[im 3/31  bone]
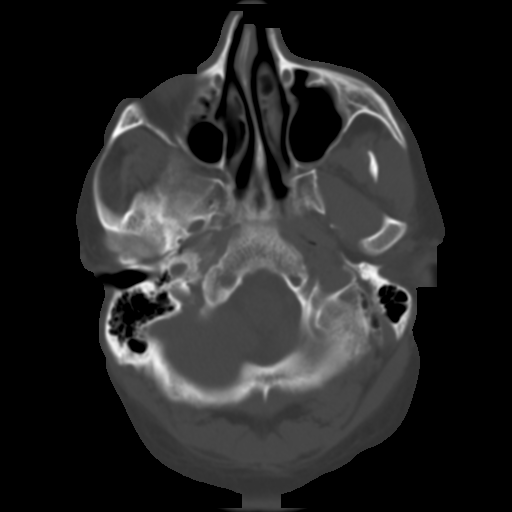
[im 6/31  brain]
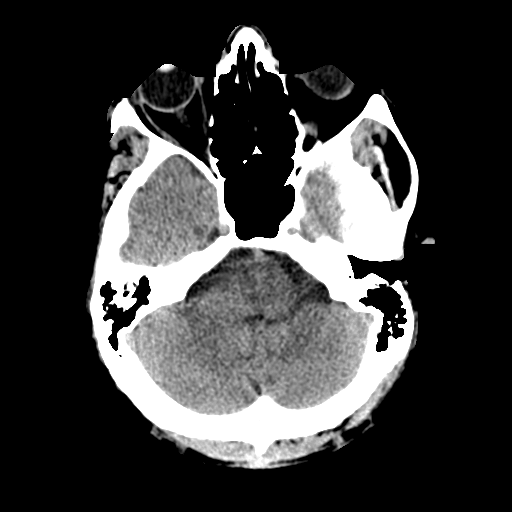
[im 9/31  brain]
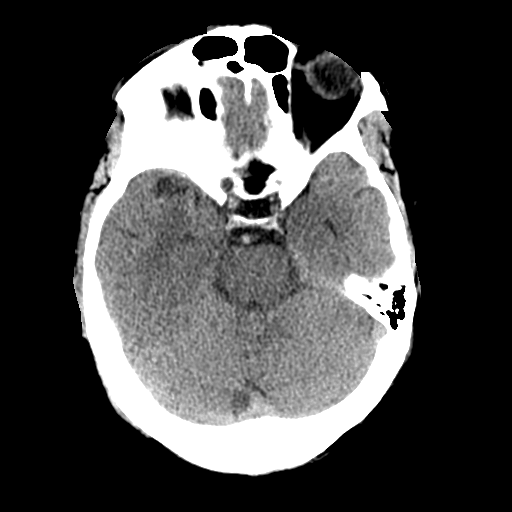
[im 12/31  brain]
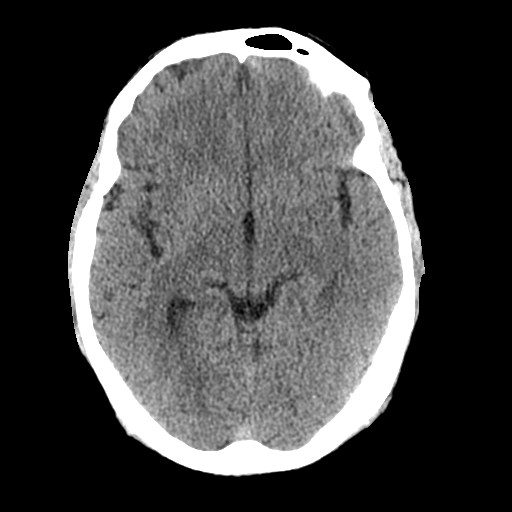
[im 16/31  brain]
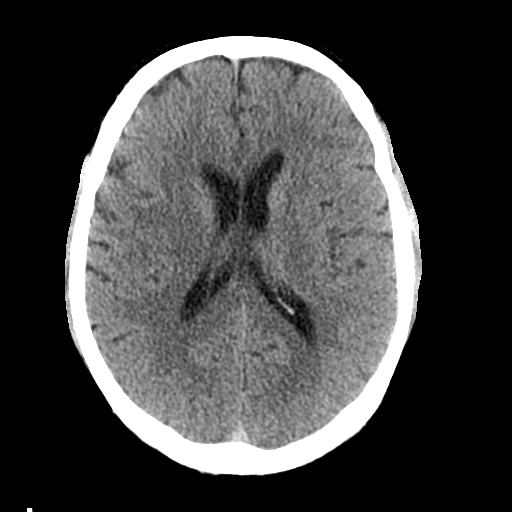
[im 16/31  bone]
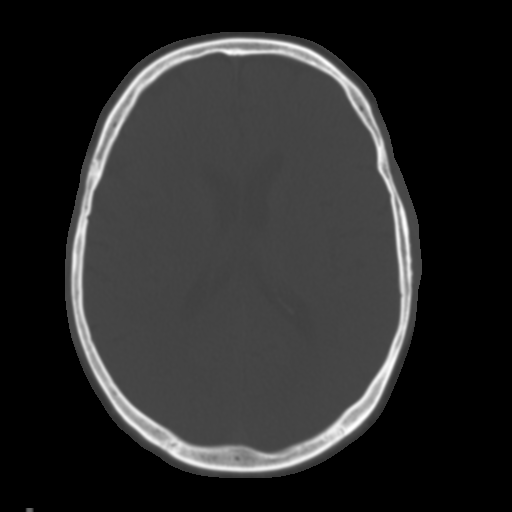
[im 19/31  brain]
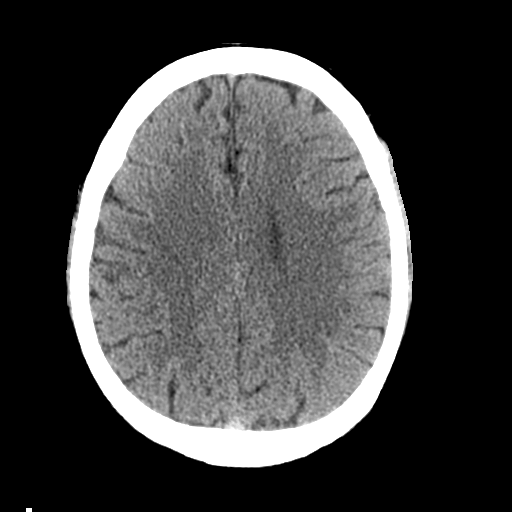
[im 22/31  brain]
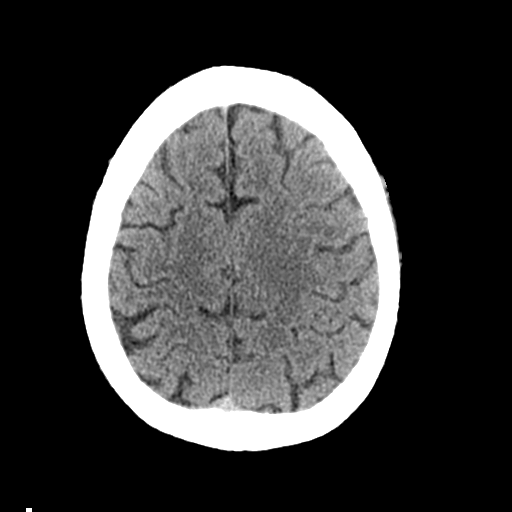
[im 25/31  brain]
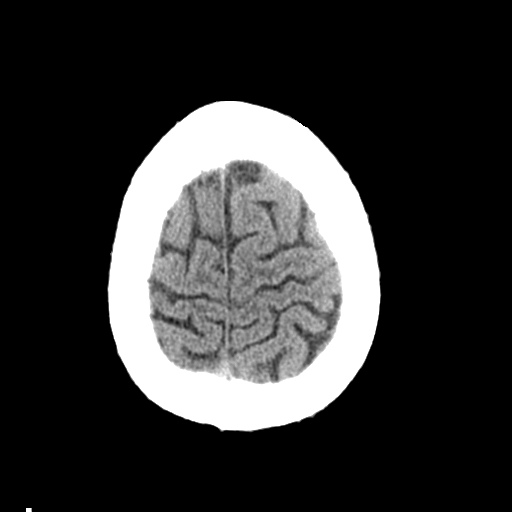
[im 28/31  brain]
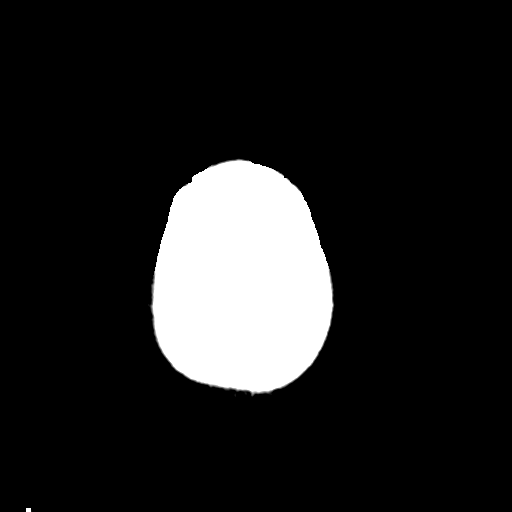
[im 28/31  bone]
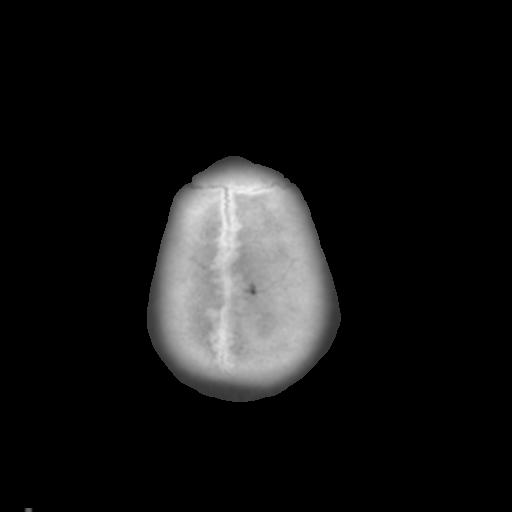

[Series 4: coronal soft tissue · coronal · 0.30mm/px · 3 of 72 slices shown]
[im 24/72  brain]
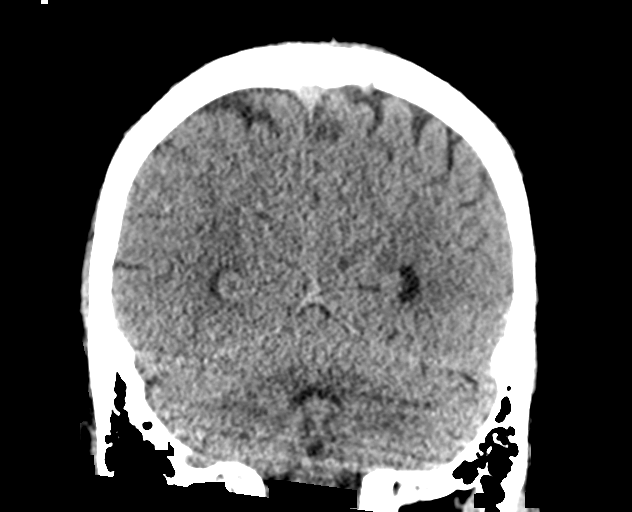
[im 32/72  brain]
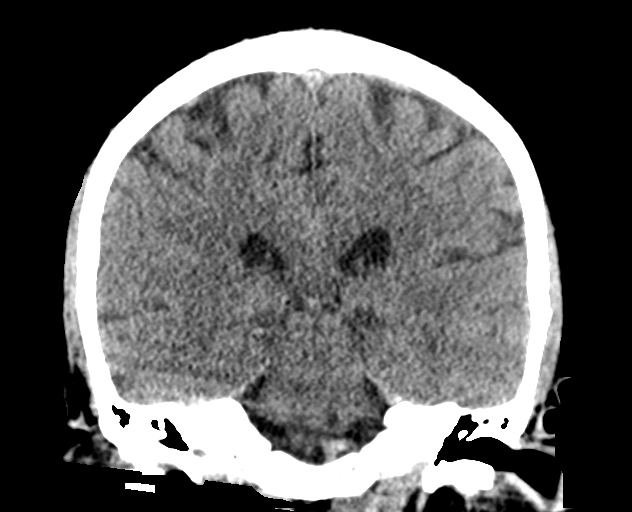
[im 40/72  brain]
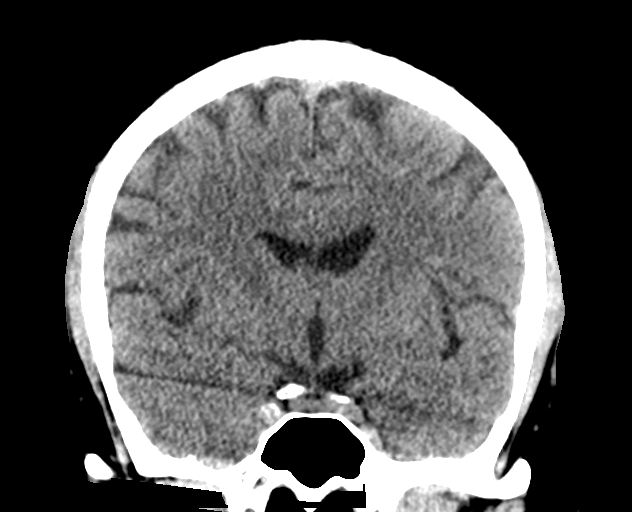

[Series 5: sagittal soft tissue · sagittal · 0.30mm/px · 3 of 55 slices shown]
[im 19/55  brain]
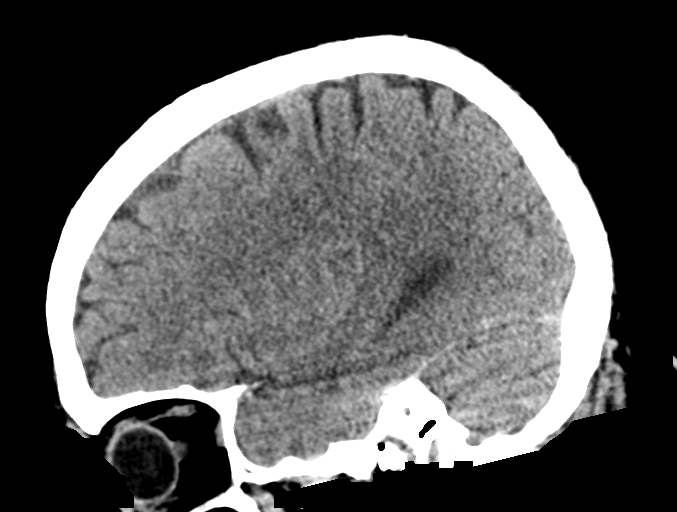
[im 28/55  brain]
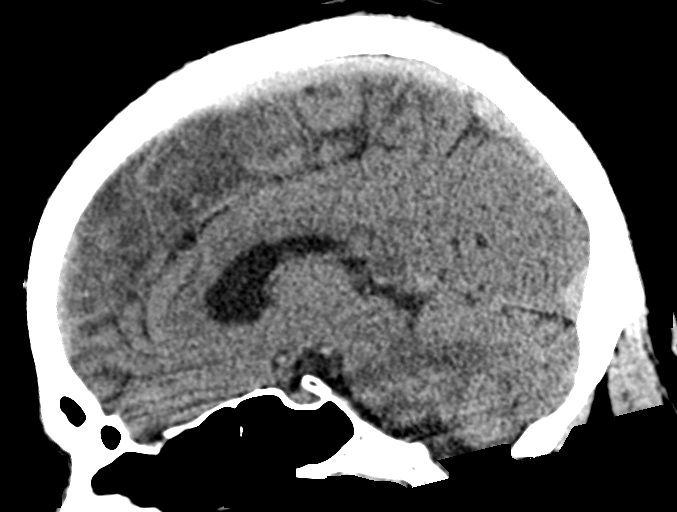
[im 37/55  brain]
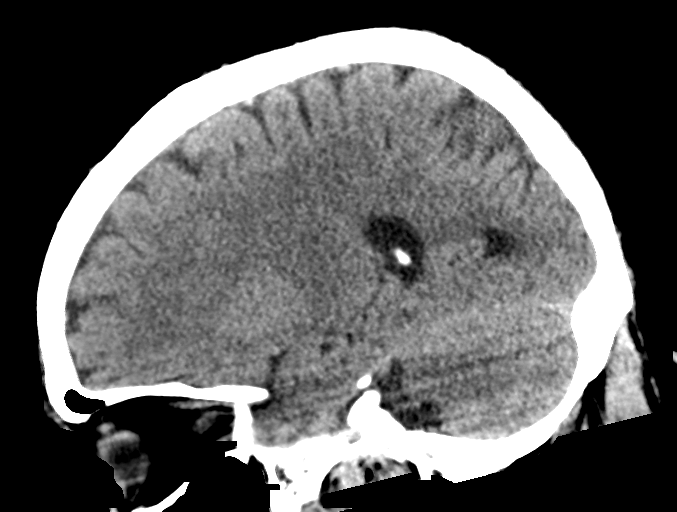

[15 of 47 positions shown; findings below may reference images not displayed]

FINDINGS: Brain: No evidence of acute infarction, hemorrhage, hydrocephalus,
extra-axial collection or mass lesion/mass effect.

Vascular: No hyperdense vessel or unexpected calcification.

Skull: Normal. Negative for fracture or focal lesion.

Sinuses/Orbits: Mild ethmoid and maxillary sinus mucosal thickening.
No sinus air-fluid levels. Clear mastoid air cells. Unremarkable
globes and orbits.

Other: None.
IMPRESSION: 1. No intracranial abnormality.  No skull fracture.

## 2018-02-24 IMAGING — DX DG ABD PORTABLE 1V
1 series · 1 of 1 positions shown · non-contrast
Comparison: CT abdomen/ pelvis 3 hours prior

CLINICAL DATA: NG tube placement.

EXAM:
PORTABLE ABDOMEN - 1 VIEW

[abdomen kub]
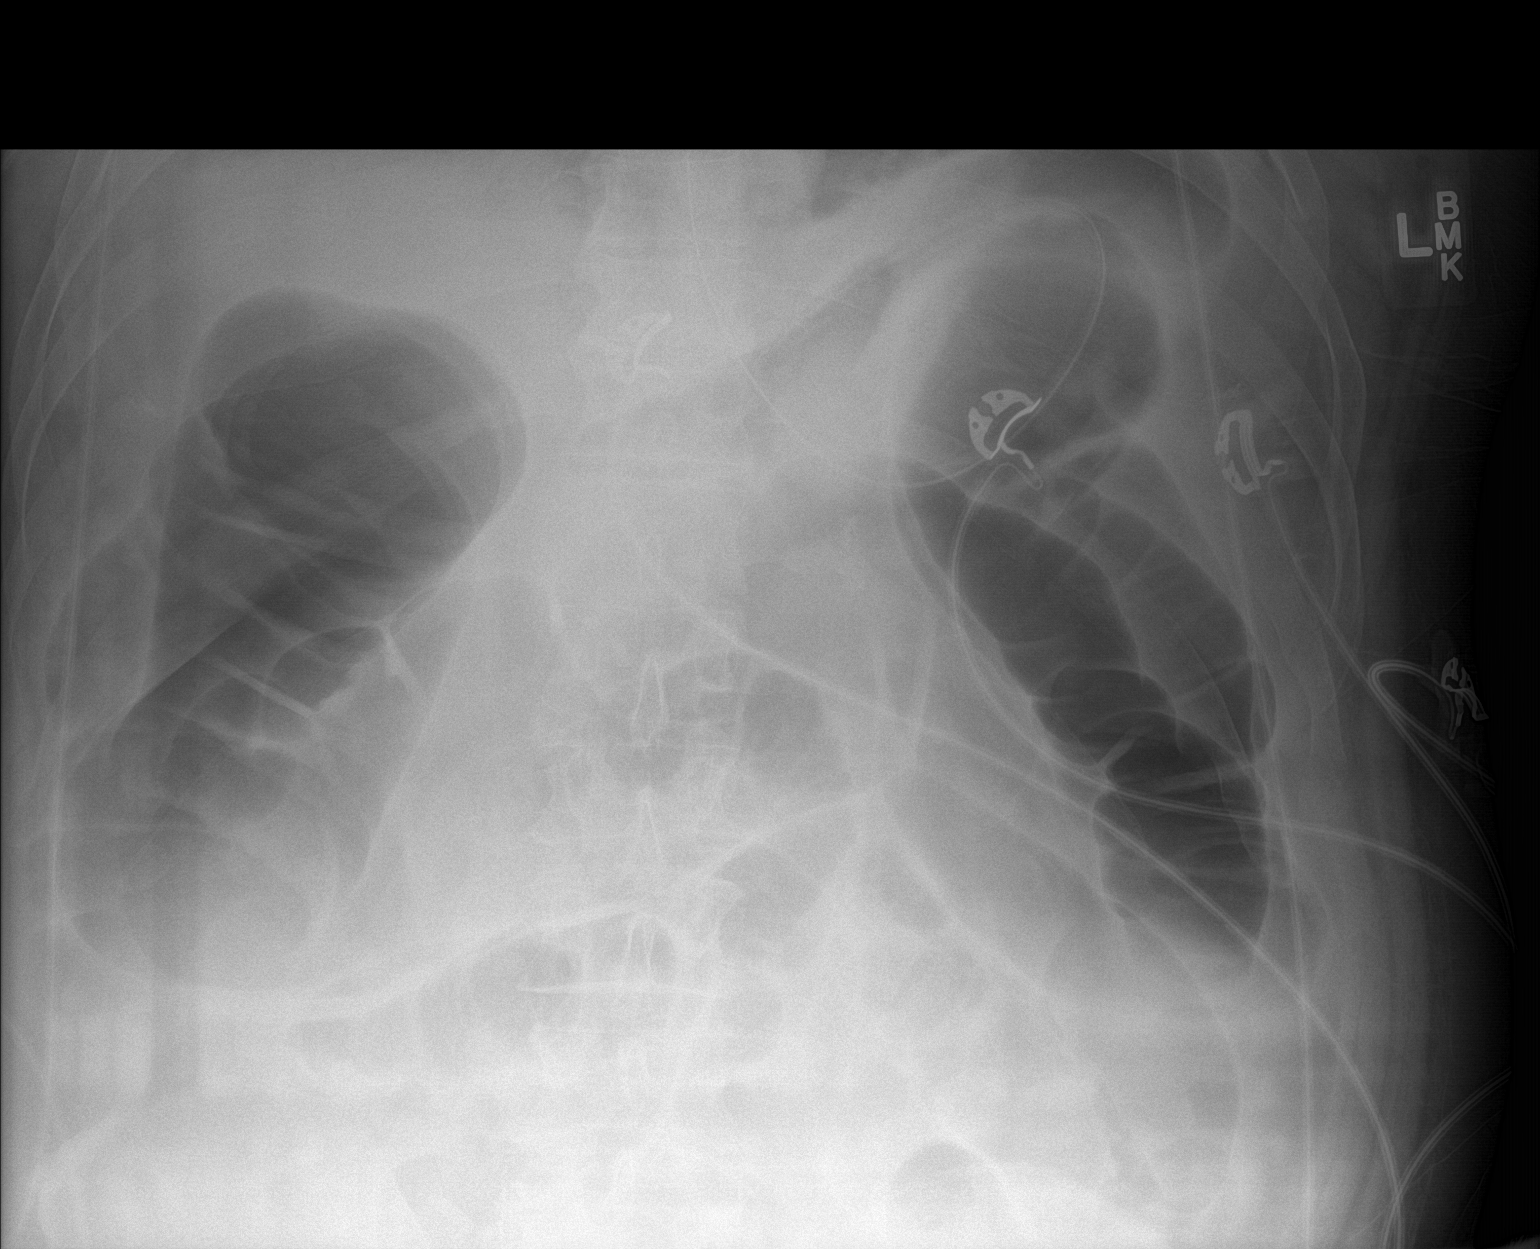

[1 of 1 positions shown; findings below may reference images not displayed]

FINDINGS: Tip and side port of the enteric tube below the diaphragm in the
stomach. Colonic distention is again seen in the upper abdomen. No
evidence of free air.
IMPRESSION: Tip and side port of the enteric tube below the diaphragm in the
stomach.

## 2018-02-25 IMAGING — DX DG ABDOMEN 1V
1 series · 1 of 1 positions shown · non-contrast
Comparison: Radiographs earlier this day, CT 08/10/2016

CLINICAL DATA: Encounter for nasogastric tube placement.

EXAM:
ABDOMEN - 1 VIEW

[abdomen kub]
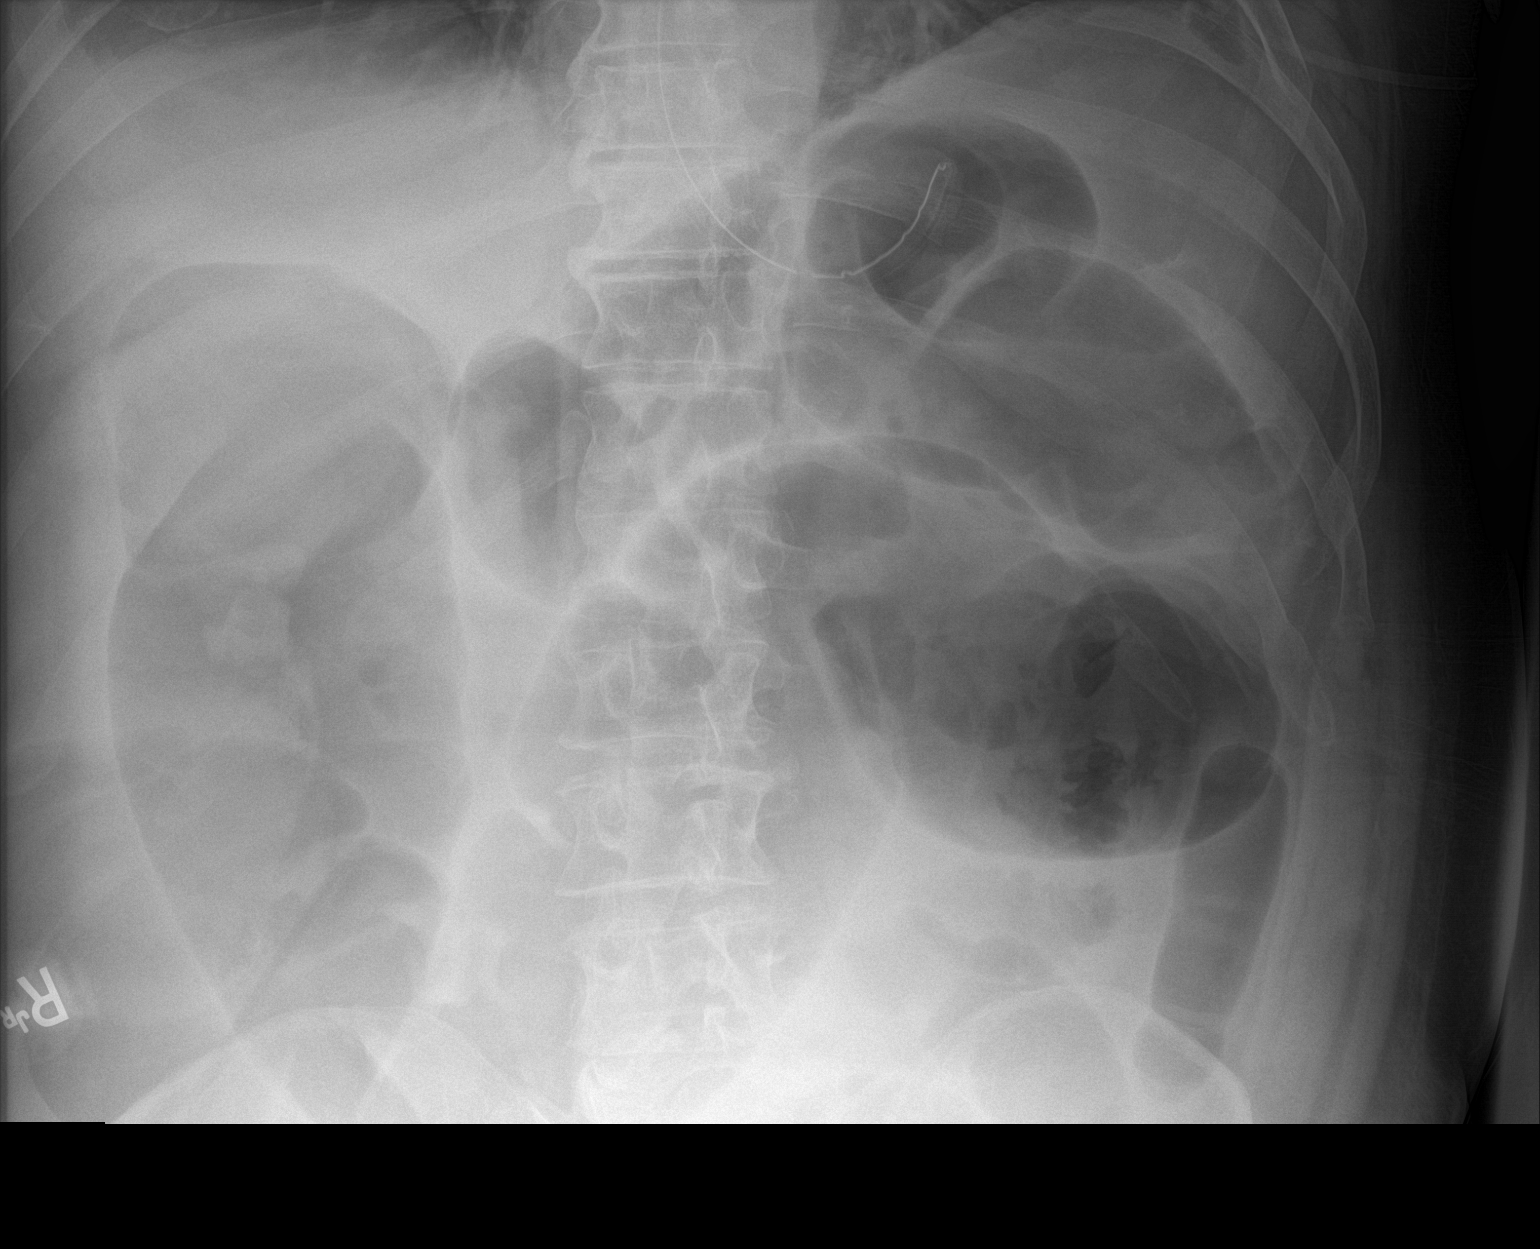

[1 of 1 positions shown; findings below may reference images not displayed]

FINDINGS: Tip and side port of the enteric tube below the diaphragm in the
stomach. Colonic gas just distention is again seen, not
significantly changed. No evidence of free air.
IMPRESSION: Tip and side port of the enteric tube below the diaphragm in the
stomach. Gaseous colonic distention is similar to prior.

## 2018-02-25 IMAGING — CR DG ABDOMEN 1V
2 series · 2 of 2 positions shown · non-contrast
Comparison: Abdominal x-ray dated 08/11/2016. CT abdomen dated
08/10/2016.

CLINICAL DATA: Ileus, NG tube in place.

EXAM:
ABDOMEN - 1 VIEW

[abdomen kub (1 of 2)]
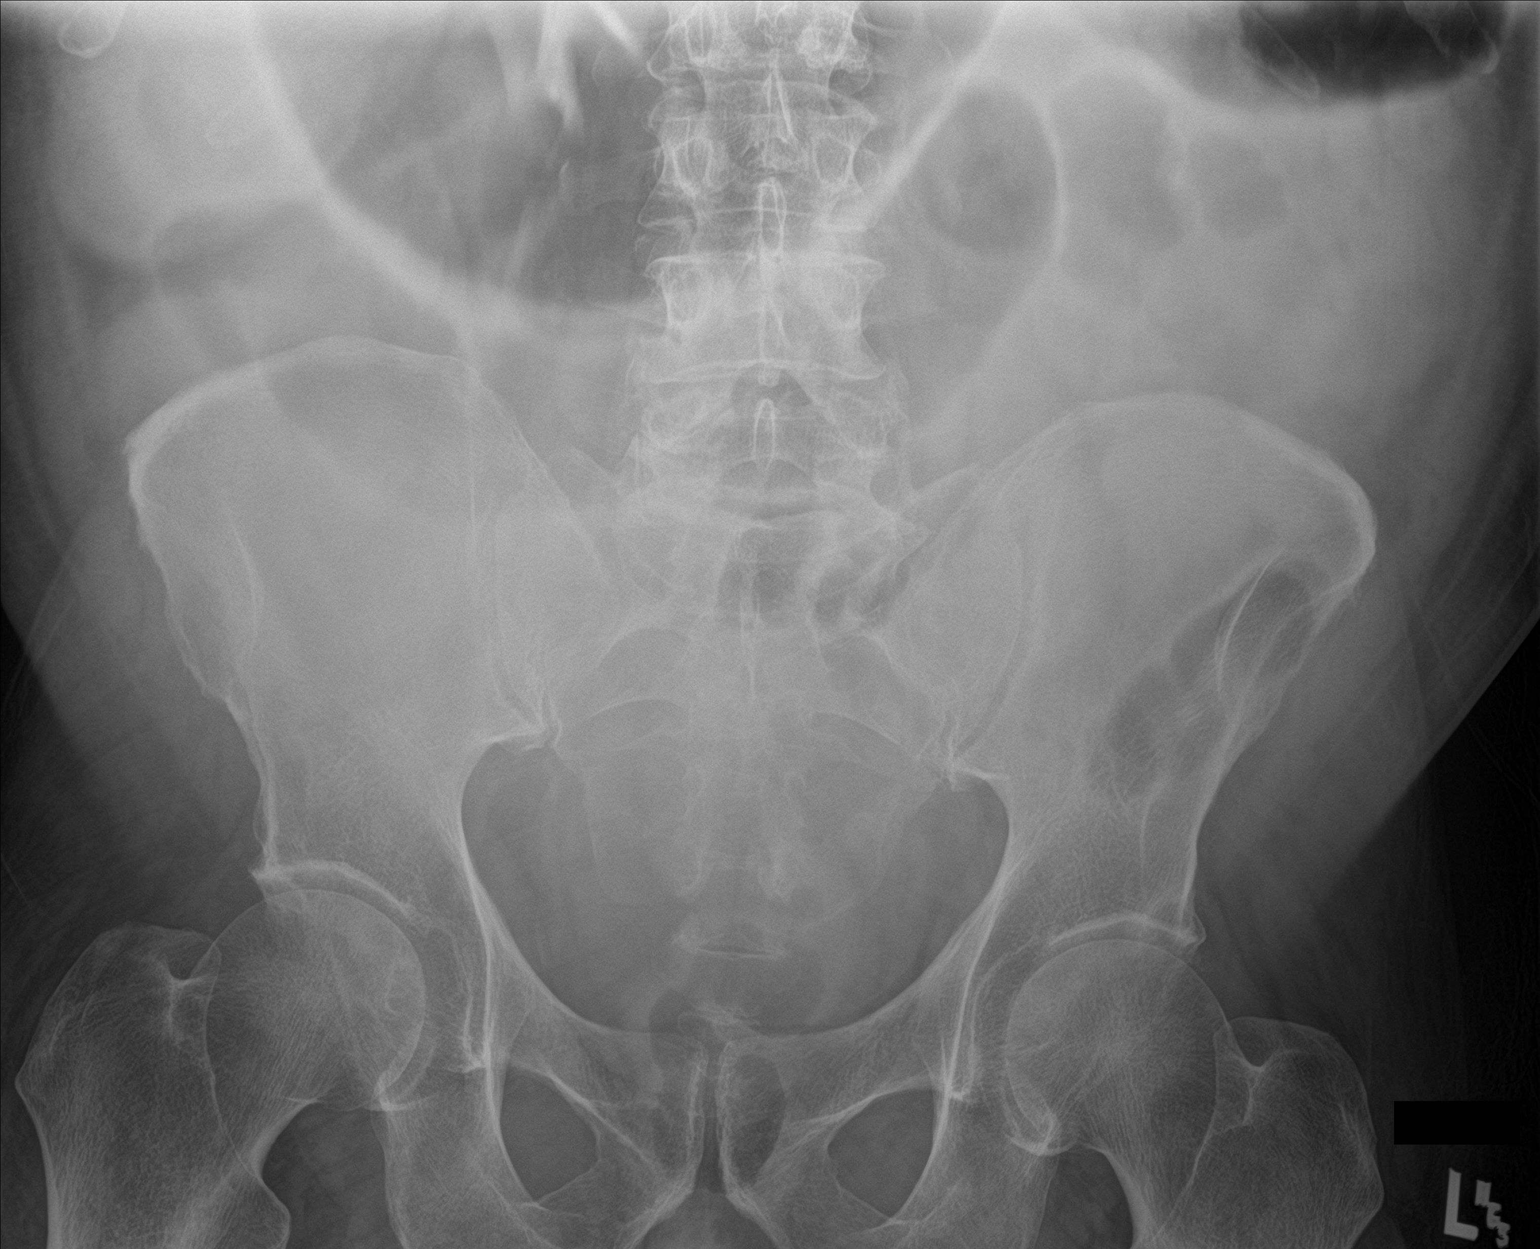

[abdomen kub (2 of 2)]
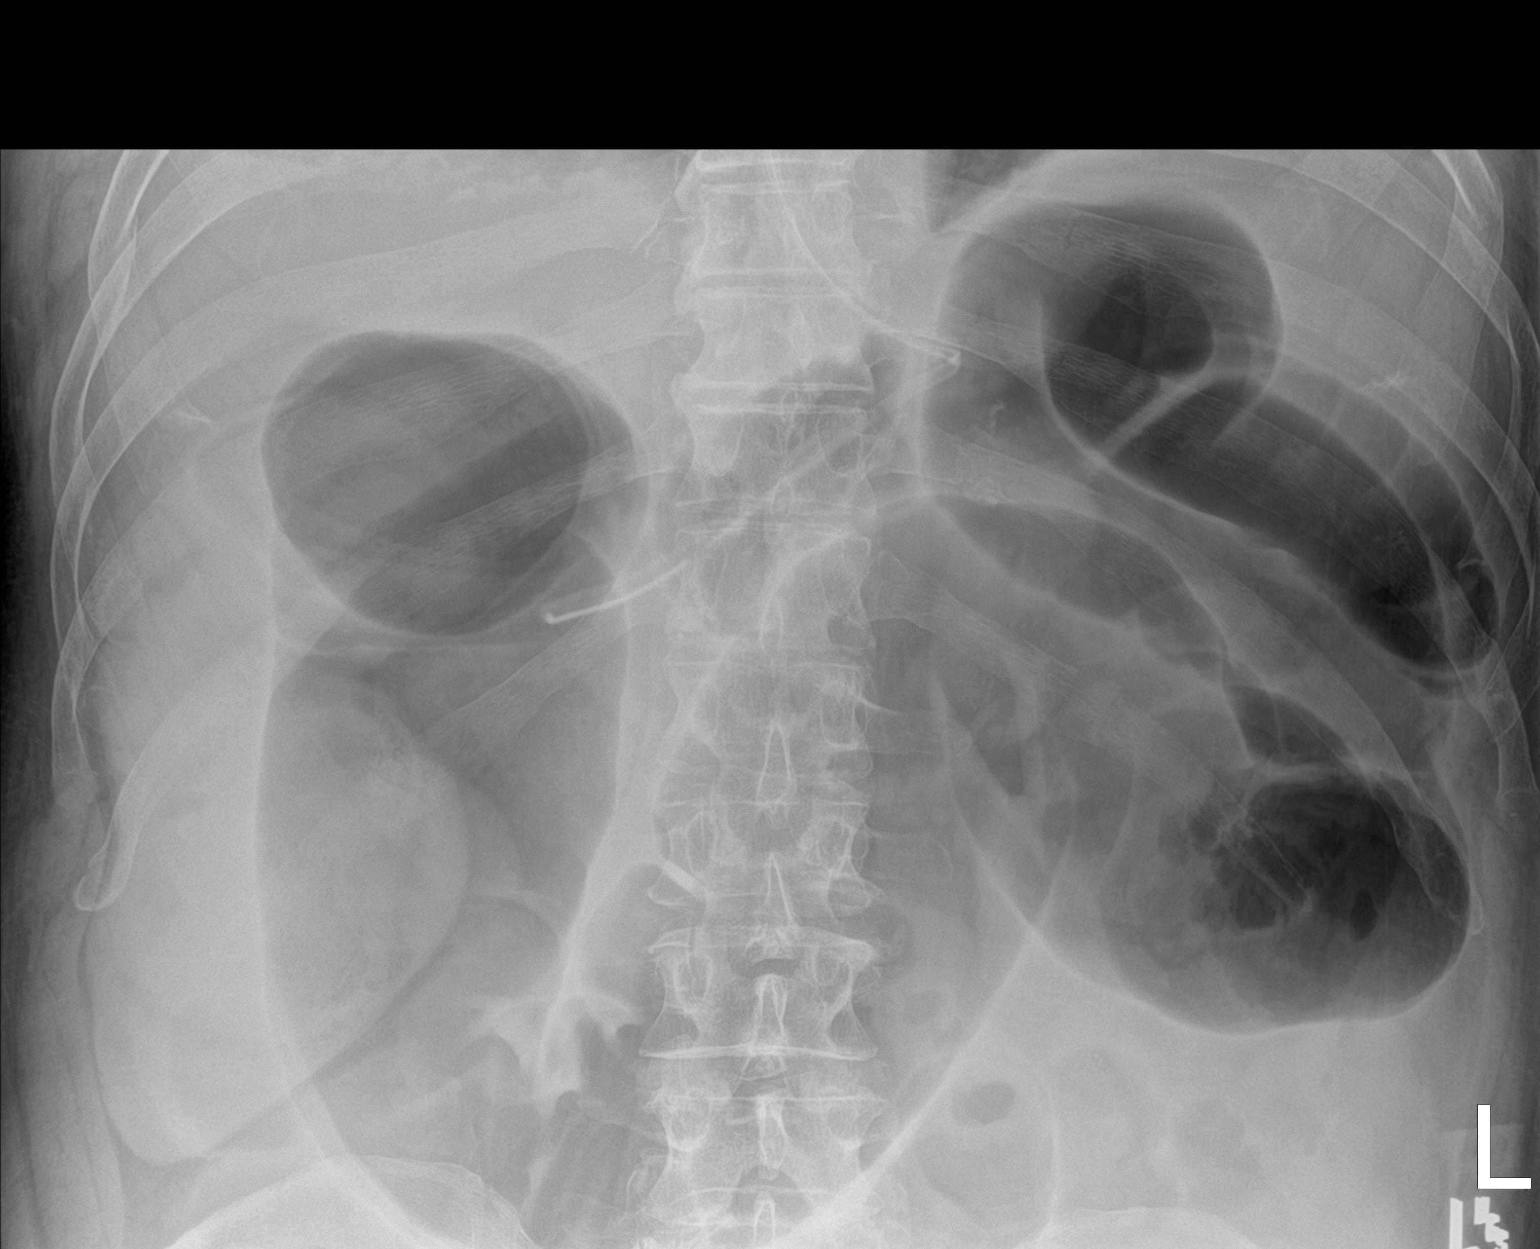

[2 of 2 positions shown; findings below may reference images not displayed]

FINDINGS: Nasogastric tube remains appropriately positioned in the stomach.
Colonic distention is not significantly changed in the short-term
interval. No dilated small bowel loops seen. No evidence of free
intraperitoneal air seen. Small pleural effusions and/or atelectasis
at each lung base.
IMPRESSION: No significant interval change. Nasogastric tube remains
appropriately positioned in the stomach. Colonic distention is not
significantly changed.

## 2018-03-19 ENCOUNTER — Telehealth: Payer: Self-pay

## 2018-03-19 NOTE — Telephone Encounter (Signed)
Spoke with patient today. He stated he has not called insurance company as of yet to see the cost of MRI vs CT and that he would call the office if he decides to move forward with scheduling.

## 2018-04-15 ENCOUNTER — Other Ambulatory Visit: Payer: Self-pay | Admitting: Physician Assistant

## 2018-04-15 DIAGNOSIS — R1011 Right upper quadrant pain: Secondary | ICD-10-CM

## 2018-04-18 ENCOUNTER — Ambulatory Visit
Admission: RE | Admit: 2018-04-18 | Discharge: 2018-04-18 | Disposition: A | Discharge status: Home / Self Care | Payer: BLUE CROSS/BLUE SHIELD | Source: Ambulatory Visit | Attending: Physician Assistant | Admitting: Physician Assistant

## 2018-04-18 DIAGNOSIS — R1011 Right upper quadrant pain: Secondary | ICD-10-CM | POA: Insufficient documentation

## 2018-11-11 ENCOUNTER — Other Ambulatory Visit: Payer: Self-pay

## 2018-11-12 LAB — CMP12+LP+TP+TSH+6AC+PSA+CBC…
ALT: 18 IU/L (ref 0–44)
AST: 21 IU/L (ref 0–40)
Albumin/Globulin Ratio: 1.8 (ref 1.2–2.2)
Albumin: 4.3 g/dL (ref 3.8–4.8)
Alkaline Phosphatase: 61 IU/L (ref 39–117)
BUN/Creatinine Ratio: 16 (ref 10–24)
BUN: 20 mg/dL (ref 8–27)
Basophils Absolute: 0.1 10*3/uL (ref 0.0–0.2)
Basos: 1 %
Bilirubin Total: 0.8 mg/dL (ref 0.0–1.2)
Calcium: 9.1 mg/dL (ref 8.6–10.2)
Chloride: 104 mmol/L (ref 96–106)
Chol/HDL Ratio: 4.9 ratio (ref 0.0–5.0)
Cholesterol, Total: 182 mg/dL (ref 100–199)
Creatinine, Ser: 1.22 mg/dL (ref 0.76–1.27)
EOS (ABSOLUTE): 0.3 10*3/uL (ref 0.0–0.4)
Eos: 6 %
Estimated CHD Risk: 1 times avg. (ref 0.0–1.0)
Free Thyroxine Index: 2.3 (ref 1.2–4.9)
GFR calc Af Amer: 74 mL/min/{1.73_m2} (ref >59)
GFR calc non Af Amer: 64 mL/min/{1.73_m2} (ref >59)
GGT: 17 IU/L (ref 0–65)
Globulin, Total: 2.4 g/dL (ref 1.5–4.5)
Glucose: 103 mg/dL — ABNORMAL HIGH (ref 65–99)
HDL: 37 mg/dL — ABNORMAL LOW (ref >39)
Hematocrit: 46 % (ref 37.5–51.0)
Hemoglobin: 15.9 g/dL (ref 13.0–17.7)
Immature Grans (Abs): 0 10*3/uL (ref 0.0–0.1)
Immature Granulocytes: 0 %
Iron: 107 ug/dL (ref 38–169)
LDH: 126 IU/L (ref 121–224)
LDL Calculated: 112 mg/dL — ABNORMAL HIGH (ref 0–99)
Lymphocytes Absolute: 1.7 10*3/uL (ref 0.7–3.1)
Lymphs: 29 %
MCH: 30.9 pg (ref 26.6–33.0)
MCHC: 34.6 g/dL (ref 31.5–35.7)
MCV: 89 fL (ref 79–97)
Monocytes Absolute: 0.7 10*3/uL (ref 0.1–0.9)
Monocytes: 13 %
Neutrophils Absolute: 3.1 10*3/uL (ref 1.4–7.0)
Neutrophils: 51 %
Phosphorus: 3.2 mg/dL (ref 2.8–4.1)
Platelets: 209 10*3/uL (ref 150–450)
Potassium: 4.6 mmol/L (ref 3.5–5.2)
Prostate Specific Ag, Serum: 0.3 ng/mL (ref 0.0–4.0)
RBC: 5.15 x10E6/uL (ref 4.14–5.80)
RDW: 12.3 % (ref 11.6–15.4)
Sodium: 141 mmol/L (ref 134–144)
T3 Uptake Ratio: 28 % (ref 24–39)
T4, Total: 8.3 ug/dL (ref 4.5–12.0)
TSH: 1.87 u[IU]/mL (ref 0.450–4.500)
Total Protein: 6.7 g/dL (ref 6.0–8.5)
Triglycerides: 166 mg/dL — ABNORMAL HIGH (ref 0–149)
Uric Acid: 7.9 mg/dL (ref 3.7–8.6)
VLDL Cholesterol Cal: 33 mg/dL (ref 5–40)
WBC: 5.9 10*3/uL (ref 3.4–10.8)

## 2019-01-01 ENCOUNTER — Telehealth: Payer: Self-pay

## 2019-01-01 NOTE — Telephone Encounter (Signed)
Arthur Koch called wanting [place] to refer him for CPAP equipment.  He recently did a DOT physical through [facility] & had to do a sleep study.  He did an at home sleep study through [employer].  [facility] wants him to see another physician that will charge him $195.00 to request the CPAP equipment - they order through [facility].  He has brought all the paper work for the sleep study & wants to know if we can order the CPAP through who we usually order from - [facility].  He considers this his primary care.  I've put the information in your office to review.  Do you want [place] to schedule him an appointment?

## 2019-01-01 NOTE — Telephone Encounter (Signed)
I think it is best to have a visit and discussion before ordering the sleep apnea equipment, as I have not been involved in this process to date.

## 2019-01-02 NOTE — Telephone Encounter (Signed)
Contacted [facility] & scheduled appointment for Monday (01/06/2019 at 1:15pm).  AMD

## 2019-01-03 LAB — TESTOSTERONE
Testosterone: 392
Testosterone: 492

## 2019-01-03 LAB — HEPATITIS B SURFACE ANTIBODY,QUALITATIVE: Hep B S Ab: 81.4

## 2019-01-03 LAB — LIPASE: Lipase: 16

## 2019-01-06 ENCOUNTER — Other Ambulatory Visit: Payer: Self-pay

## 2019-01-06 ENCOUNTER — Encounter: Payer: Self-pay | Admitting: Internal Medicine

## 2019-01-06 ENCOUNTER — Ambulatory Visit: Payer: Self-pay | Admitting: Internal Medicine

## 2019-01-06 VITALS — BP 134/80 | HR 77 | Temp 97.6°F | Resp 16 | Ht 71.5 in | Wt 225.0 lb

## 2019-01-06 DIAGNOSIS — E669 Obesity, unspecified: Secondary | ICD-10-CM | POA: Insufficient documentation

## 2019-01-06 DIAGNOSIS — Z6830 Body mass index (BMI) 30.0-30.9, adult: Secondary | ICD-10-CM

## 2019-01-06 DIAGNOSIS — G4733 Obstructive sleep apnea (adult) (pediatric): Secondary | ICD-10-CM | POA: Insufficient documentation

## 2019-01-06 DIAGNOSIS — R7301 Impaired fasting glucose: Secondary | ICD-10-CM | POA: Insufficient documentation

## 2019-01-06 DIAGNOSIS — E6609 Other obesity due to excess calories: Secondary | ICD-10-CM

## 2019-01-06 DIAGNOSIS — I77810 Thoracic aortic ectasia: Secondary | ICD-10-CM

## 2019-01-06 DIAGNOSIS — E7849 Other hyperlipidemia: Secondary | ICD-10-CM | POA: Insufficient documentation

## 2019-01-06 DIAGNOSIS — I10 Essential (primary) hypertension: Secondary | ICD-10-CM | POA: Insufficient documentation

## 2019-01-06 NOTE — Progress Notes (Signed)
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   He was not clear on what the studies showed, just that CPAP was rec'ed.   He has yet to f/u for a repeat eval of his thoracic aneurysm, last was in late 2018.  Denies any recent sx's of concern  Last note from June exam reviewed.     No Known Allergies Current Outpatient Medications on File Prior to Visit  Medication Sig Dispense Refill  . aspirin EC 81 MG tablet Take 81 mg by mouth daily.    . B Complex Vitamins (B COMPLEX PO) Take 1 tablet daily by mouth. CURRENTLY OUT    . lisinopril-hydrochlorothiazide (PRINZIDE,ZESTORETIC) 20-12.5 MG tablet Take 1 tablet by mouth daily.  2  . Omega-3 Fatty Acids (FISH OIL) 1000 MG CAPS Take 1,000 mg daily by mouth. CURRENTLY OUT    . pantoprazole (PROTONIX) 40 MG tablet Take 40 mg daily after lunch by mouth.   2   No current facility-administered medications on file prior to visit.    Tob - Former smoker, quit 1985  O - NAD, masked, obese BP 134/80 (BP Location: Left Arm, Patient Position: Sitting, Cuff Size: Large)   Pulse 77   Temp 97.6 F (36.4 C) (Oral)   Resp 16   Ht 5' 11.5" (1.816 m)   Wt 225 lb (102.1 kg)   SpO2 95%   BMI 30.94 kg/m  Sclera anicteric Affect was not flat, approp with conversation  Sleep studies reviewed as well as the paperwork from the evaluation that prompted the sleep study. Above moderate for snoring and moderate sleep apnea noted  Ass/Plan: 1. Sleep Apnea - moderate by sleep study, reviewed the results with him today including the snoring assessment  Forms completed for patient to get the supplies needed for CPAP Do feel would be helpful  Discussed having the ability to get the supplies needed to bring with  him to DOT exams in the future to ensure compliance with the CPAP needed for that exam and he was made aware of that from his recent DOT examiner  2. HTN - controlled on medicines, and importance of good control emphasized with his thoracic aneurysm.   Continue medications to manage Discussed goals for good control of BP  Importance of healthy diet and regular aerobic exercise and weight control noted Continue to monitor  3. Thoracic Aneurysm (ascending aorta dilatation) - last CT in late 2018 and reviewed with him the rec's to get annually on his f/u with me in the office in June (as per the surgeons rec during their last visit). He noted to them the cost concern with this.  Discussed the DOT guidelines and rec's with this  Rec'ed he f/u with them again as it is getting close to two years now since last scan and he noted he plans to Also, noted radiation doses of CT's regularly can be a concern and if they can follow with MRI (or U/S as technology with this is improving almost daily), would be best  4.  Hyperlipidemia - LDL 112, TC - 182, TG - 166, HDL - 37  Educated on the role of medications, risk/benefits and a statin  may be seriously considered to try to get LDL closer to 70  Importance of diet modifications emphasized, and he noted he needs to lose 30 lbs to help as well, encouraged  5. Increased BMI/obesity  Importance of diet modifications and appropriate weight loss goals discussed with lifestyle changes adopted for the long term very important for success  6. IFG - prediabetes - glucose 103 on last check   Lifestyle changes as above important for this as well  7. Colonoscopy for screening not due again until 2025 noted  8. GERD history - encouraged to try to use less of the PPI regularly at the PE in June

## 2019-01-16 DIAGNOSIS — G4733 Obstructive sleep apnea (adult) (pediatric): Secondary | ICD-10-CM | POA: Diagnosis not present

## 2019-02-16 DIAGNOSIS — G4733 Obstructive sleep apnea (adult) (pediatric): Secondary | ICD-10-CM | POA: Diagnosis not present

## 2019-03-18 DIAGNOSIS — G4733 Obstructive sleep apnea (adult) (pediatric): Secondary | ICD-10-CM | POA: Diagnosis not present

## 2019-04-18 DIAGNOSIS — G4733 Obstructive sleep apnea (adult) (pediatric): Secondary | ICD-10-CM | POA: Diagnosis not present

## 2019-05-09 DIAGNOSIS — G4733 Obstructive sleep apnea (adult) (pediatric): Secondary | ICD-10-CM | POA: Diagnosis not present

## 2019-05-18 DIAGNOSIS — G4733 Obstructive sleep apnea (adult) (pediatric): Secondary | ICD-10-CM | POA: Diagnosis not present

## 2019-05-19 ENCOUNTER — Other Ambulatory Visit: Payer: Self-pay

## 2019-05-19 DIAGNOSIS — Z20822 Contact with and (suspected) exposure to covid-19: Secondary | ICD-10-CM

## 2019-05-20 ENCOUNTER — Telehealth: Payer: Self-pay | Admitting: General Practice

## 2019-05-20 LAB — NOVEL CORONAVIRUS, NAA: SARS-CoV-2, NAA: NOT DETECTED

## 2019-05-20 NOTE — Telephone Encounter (Signed)
Negative COVID results given. Patient results "NOT Detected." Caller expressed understanding. ° °

## 2019-06-04 ENCOUNTER — Other Ambulatory Visit: Payer: Self-pay

## 2019-06-04 DIAGNOSIS — K219 Gastro-esophageal reflux disease without esophagitis: Secondary | ICD-10-CM

## 2019-06-04 MED ORDER — PANTOPRAZOLE SODIUM 40 MG PO TBEC: 40.0000 mg | DELAYED_RELEASE_TABLET | Freq: Every day | ORAL | 2 refills | Status: DC

## 2019-06-08 DIAGNOSIS — G4733 Obstructive sleep apnea (adult) (pediatric): Secondary | ICD-10-CM | POA: Diagnosis not present

## 2019-06-18 DIAGNOSIS — G4733 Obstructive sleep apnea (adult) (pediatric): Secondary | ICD-10-CM | POA: Diagnosis not present

## 2019-06-23 ENCOUNTER — Telehealth: Payer: Self-pay

## 2019-06-23 NOTE — Telephone Encounter (Signed)
Received prior authorization request from [employer] [facility]).  06/20/19 4:44 PM Contacted [employer] at [phone] & spoke with customer service representative [facility]) about prior authorization for medication.  Pantoprazole 40 mg 1 tab po qd #30 GERD (K21.9)  Gerarda Fraction, NP-C NPI [id number]  Prior authorization approved for 3 years staring today (06/20/19 - 06/19/22) & OK for pharmacy to process Rx or refills starting today.  AMD

## 2019-07-09 DIAGNOSIS — G4733 Obstructive sleep apnea (adult) (pediatric): Secondary | ICD-10-CM | POA: Diagnosis not present

## 2019-07-19 DIAGNOSIS — G4733 Obstructive sleep apnea (adult) (pediatric): Secondary | ICD-10-CM | POA: Diagnosis not present

## 2019-08-16 DIAGNOSIS — G4733 Obstructive sleep apnea (adult) (pediatric): Secondary | ICD-10-CM | POA: Diagnosis not present

## 2019-08-26 DIAGNOSIS — M9903 Segmental and somatic dysfunction of lumbar region: Secondary | ICD-10-CM | POA: Diagnosis not present

## 2019-08-26 DIAGNOSIS — M531 Cervicobrachial syndrome: Secondary | ICD-10-CM | POA: Diagnosis not present

## 2019-08-26 DIAGNOSIS — M9901 Segmental and somatic dysfunction of cervical region: Secondary | ICD-10-CM | POA: Diagnosis not present

## 2019-08-26 DIAGNOSIS — M9904 Segmental and somatic dysfunction of sacral region: Secondary | ICD-10-CM | POA: Diagnosis not present

## 2019-08-26 DIAGNOSIS — M9902 Segmental and somatic dysfunction of thoracic region: Secondary | ICD-10-CM | POA: Diagnosis not present

## 2019-08-26 DIAGNOSIS — M545 Low back pain: Secondary | ICD-10-CM | POA: Diagnosis not present

## 2019-09-16 DIAGNOSIS — G4733 Obstructive sleep apnea (adult) (pediatric): Secondary | ICD-10-CM | POA: Diagnosis not present

## 2019-10-16 DIAGNOSIS — G4733 Obstructive sleep apnea (adult) (pediatric): Secondary | ICD-10-CM | POA: Diagnosis not present

## 2019-11-04 ENCOUNTER — Other Ambulatory Visit: Payer: Self-pay

## 2019-11-04 DIAGNOSIS — I10 Essential (primary) hypertension: Secondary | ICD-10-CM

## 2019-11-04 MED ORDER — LISINOPRIL-HYDROCHLOROTHIAZIDE 20-12.5 MG PO TABS: 1.0000 | ORAL_TABLET | Freq: Every day | ORAL | 2 refills | Status: DC

## 2019-11-11 DIAGNOSIS — R6889 Other general symptoms and signs: Secondary | ICD-10-CM | POA: Diagnosis not present

## 2019-11-11 DIAGNOSIS — Z03818 Encounter for observation for suspected exposure to other biological agents ruled out: Secondary | ICD-10-CM | POA: Diagnosis not present

## 2019-11-27 ENCOUNTER — Other Ambulatory Visit: Payer: Self-pay | Admitting: Physician Assistant

## 2019-11-27 DIAGNOSIS — K219 Gastro-esophageal reflux disease without esophagitis: Secondary | ICD-10-CM

## 2019-11-28 ENCOUNTER — Other Ambulatory Visit: Payer: Self-pay | Admitting: Physician Assistant

## 2019-11-28 DIAGNOSIS — K219 Gastro-esophageal reflux disease without esophagitis: Secondary | ICD-10-CM

## 2019-12-01 ENCOUNTER — Other Ambulatory Visit: Payer: Self-pay

## 2019-12-01 DIAGNOSIS — K219 Gastro-esophageal reflux disease without esophagitis: Secondary | ICD-10-CM

## 2019-12-01 MED ORDER — PANTOPRAZOLE SODIUM 40 MG PO TBEC: 40.0000 mg | DELAYED_RELEASE_TABLET | Freq: Every day | ORAL | 5 refills | Status: DC

## 2019-12-08 DIAGNOSIS — G4733 Obstructive sleep apnea (adult) (pediatric): Secondary | ICD-10-CM | POA: Diagnosis not present

## 2019-12-25 DIAGNOSIS — R300 Dysuria: Secondary | ICD-10-CM | POA: Diagnosis not present

## 2019-12-25 DIAGNOSIS — N390 Urinary tract infection, site not specified: Secondary | ICD-10-CM | POA: Diagnosis not present

## 2019-12-25 DIAGNOSIS — Z87448 Personal history of other diseases of urinary system: Secondary | ICD-10-CM | POA: Diagnosis not present

## 2019-12-26 ENCOUNTER — Other Ambulatory Visit: Payer: Self-pay

## 2019-12-26 ENCOUNTER — Ambulatory Visit: Payer: Self-pay

## 2019-12-26 DIAGNOSIS — Z0000 Encounter for general adult medical examination without abnormal findings: Secondary | ICD-10-CM

## 2019-12-26 NOTE — Progress Notes (Signed)
Pt current taking antibiotics for UTI.  Order for UA is canceled.

## 2019-12-27 LAB — CMP12+LP+TP+TSH+6AC+PSA+CBC…
ALT: 14 IU/L (ref 0–44)
AST: 15 IU/L (ref 0–40)
Albumin/Globulin Ratio: 1.7 (ref 1.2–2.2)
Albumin: 4.5 g/dL (ref 3.8–4.8)
Alkaline Phosphatase: 72 IU/L (ref 48–121)
BUN/Creatinine Ratio: 13 (ref 10–24)
BUN: 17 mg/dL (ref 8–27)
Basophils Absolute: 0.1 10*3/uL (ref 0.0–0.2)
Basos: 1 %
Bilirubin Total: 0.6 mg/dL (ref 0.0–1.2)
Calcium: 9.6 mg/dL (ref 8.6–10.2)
Chloride: 104 mmol/L (ref 96–106)
Chol/HDL Ratio: 4.6 ratio (ref 0.0–5.0)
Cholesterol, Total: 188 mg/dL (ref 100–199)
Creatinine, Ser: 1.28 mg/dL — ABNORMAL HIGH (ref 0.76–1.27)
EOS (ABSOLUTE): 0.5 10*3/uL — ABNORMAL HIGH (ref 0.0–0.4)
Eos: 8 %
Estimated CHD Risk: 0.9 times avg. (ref 0.0–1.0)
Free Thyroxine Index: 1.9 (ref 1.2–4.9)
GFR calc Af Amer: 69 mL/min/{1.73_m2} (ref >59)
GFR calc non Af Amer: 60 mL/min/{1.73_m2} (ref >59)
GGT: 19 IU/L (ref 0–65)
Globulin, Total: 2.6 g/dL (ref 1.5–4.5)
Glucose: 99 mg/dL (ref 65–99)
HDL: 41 mg/dL (ref >39)
Hematocrit: 46.6 % (ref 37.5–51.0)
Hemoglobin: 15.9 g/dL (ref 13.0–17.7)
Immature Grans (Abs): 0 10*3/uL (ref 0.0–0.1)
Immature Granulocytes: 0 %
Iron: 68 ug/dL (ref 38–169)
LDH: 128 IU/L (ref 121–224)
LDL Chol Calc (NIH): 129 mg/dL — ABNORMAL HIGH (ref 0–99)
Lymphocytes Absolute: 1.6 10*3/uL (ref 0.7–3.1)
Lymphs: 28 %
MCH: 31 pg (ref 26.6–33.0)
MCHC: 34.1 g/dL (ref 31.5–35.7)
MCV: 91 fL (ref 79–97)
Monocytes Absolute: 0.7 10*3/uL (ref 0.1–0.9)
Monocytes: 11 %
Neutrophils Absolute: 3 10*3/uL (ref 1.4–7.0)
Neutrophils: 52 %
Phosphorus: 3.3 mg/dL (ref 2.8–4.1)
Platelets: 234 10*3/uL (ref 150–450)
Potassium: 5.2 mmol/L (ref 3.5–5.2)
Prostate Specific Ag, Serum: 0.7 ng/mL (ref 0.0–4.0)
RBC: 5.13 x10E6/uL (ref 4.14–5.80)
RDW: 12.4 % (ref 11.6–15.4)
Sodium: 142 mmol/L (ref 134–144)
T3 Uptake Ratio: 27 % (ref 24–39)
T4, Total: 7 ug/dL (ref 4.5–12.0)
TSH: 5.58 u[IU]/mL — ABNORMAL HIGH (ref 0.450–4.500)
Total Protein: 7.1 g/dL (ref 6.0–8.5)
Triglycerides: 98 mg/dL (ref 0–149)
Uric Acid: 8.7 mg/dL — ABNORMAL HIGH (ref 3.8–8.4)
VLDL Cholesterol Cal: 18 mg/dL (ref 5–40)
WBC: 5.7 10*3/uL (ref 3.4–10.8)

## 2019-12-31 ENCOUNTER — Encounter: Payer: Self-pay | Admitting: Emergency Medicine

## 2019-12-31 ENCOUNTER — Ambulatory Visit: Payer: Self-pay | Admitting: Emergency Medicine

## 2019-12-31 ENCOUNTER — Other Ambulatory Visit: Payer: Self-pay

## 2019-12-31 VITALS — BP 145/82 | HR 65 | Temp 98.7°F | Resp 14 | Ht 71.5 in | Wt 242.0 lb

## 2019-12-31 DIAGNOSIS — I77810 Thoracic aortic ectasia: Secondary | ICD-10-CM

## 2019-12-31 DIAGNOSIS — Z0000 Encounter for general adult medical examination without abnormal findings: Secondary | ICD-10-CM

## 2019-12-31 MED ORDER — SILDENAFIL CITRATE 25 MG PO TABS: 25.0000 mg | ORAL_TABLET | Freq: Every day | ORAL | 0 refills | Status: DC | PRN

## 2019-12-31 NOTE — Progress Notes (Signed)
Provider Note       Time seen: 10:20 AM   I have reviewed the vital signs and the nursing notes.  HISTORY   Chief Complaint Annual Exam    HPI Arthur Koch is a 64 y.o. male with a history of diverticulitis, GERD, gout, hypertension, prediabetes who presents today for annual physical examination and screening.  Patient denies any specific complaints, is requesting refill of Viagra.  States he has not followed up in several years to evaluate his thoracic aneurysm.  Past Medical History:  Diagnosis Date  . Decreased hearing   . Diverticulitis   . ED (erectile dysfunction)   . Family history of Parkinson disease   . Family hx of prostate cancer   . GERD (gastroesophageal reflux disease)   . Gout   . History of urethral stricture   . Hypertension   . Ileus ([place])   . Inguinal hernia, right   . Overweight   . Prediabetes   . Rib fractures 08/2016   FX 8 RIBS-FELL OFF A LADDER  . Sleep apnea   . Thoracic aortic aneurysm ([place]) 05/11/2017   4.4cm see on CT    Past Surgical History:  Procedure Laterality Date  . CYSTOSCOPY WITH URETHRAL DILATATION N/A 04/27/2017   Procedure: CYSTOSCOPY WITH MEATAL DILATION;  Surgeon: Abbie Sons, MD;  Location: [facility] ORS;  Service: Urology;  Laterality: N/A;  . PENILE BIOPSY N/A 04/27/2017   Procedure: PENILE BIOPSY;  Surgeon: Abbie Sons, MD;  Location: [facility] ORS;  Service: Urology;  Laterality: N/A;  . URETHRA SURGERY      Allergies Patient has no known allergies.   Review of Systems Constitutional: Negative for fever. Cardiovascular: Negative for chest pain. Respiratory: Negative for shortness of breath. Gastrointestinal: Negative for abdominal pain, vomiting and diarrhea. Musculoskeletal: Negative for back pain. Skin: Negative for rash. Neurological: Negative for headaches, focal weakness or numbness.  All systems negative/normal/unremarkable except as stated in the  HPI  ____________________________________________   PHYSICAL EXAM:  VITAL SIGNS: Vitals:   12/31/19 1004  BP: (!) 145/82  Pulse: 65  Resp: 14  Temp: 98.7 F (37.1 C)  SpO2: 96%    Constitutional: Alert and oriented. Well appearing and in no distress. Eyes: Conjunctivae are normal. Normal extraocular movements. ENT      Head: Normocephalic and atraumatic.      Nose: No congestion/rhinnorhea.      Mouth/Throat: Mucous membranes are moist.      Neck: No stridor. Cardiovascular: Normal rate, regular rhythm. No murmurs, rubs, or gallops. Respiratory: Normal respiratory effort without tachypnea nor retractions. Breath sounds are clear and equal bilaterally. No wheezes/rales/rhonchi. Gastrointestinal: Soft and nontender. Normal bowel sounds Musculoskeletal: Nontender with normal range of motion in extremities. No lower extremity tenderness nor edema. Neurologic:  Normal speech and language. No gross focal neurologic deficits are appreciated.  Skin:  Skin is warm, dry and intact. No rash noted. Psychiatric: Speech and behavior are normal.  ____________________________________________  EKG: Interpreted by me.  Sinus rhythm with rate of 61 bpm, normal PR interval, normal QRS, occasional PVC  ____________________________________________   LABS (pertinent positives/negatives)  Recent Results (from the past 2160 hour(s))  Male Executive Panel     Status: Abnormal   Collection Time: 12/26/19  8:16 AM  Result Value Ref Range   Glucose 99 65 - 99 mg/dL   Uric Acid 8.7 (H) 3.8 - 8.4 mg/dL    Comment:            Therapeutic  target for gout patients: <6.0   BUN 17 8 - 27 mg/dL   Creatinine, Ser 1.28 (H) 0.76 - 1.27 mg/dL   GFR calc non Af Amer 60 >59 mL/min/1.73   GFR calc Af Amer 69 >59 mL/min/1.73    Comment: **[facility] currently reports eGFR in compliance with the current**   recommendations of the [employer]. [facility] will   update reporting as new guidelines are  published from the NKF-ASN   Task force.    BUN/Creatinine Ratio 13 10 - 24   Sodium 142 134 - 144 mmol/L   Potassium 5.2 3.5 - 5.2 mmol/L   Chloride 104 96 - 106 mmol/L   Calcium 9.6 8.6 - 10.2 mg/dL   Phosphorus 3.3 2.8 - 4.1 mg/dL   Total Protein 7.1 6.0 - 8.5 g/dL   Albumin 4.5 3.8 - 4.8 g/dL   Globulin, Total 2.6 1.5 - 4.5 g/dL   Albumin/Globulin Ratio 1.7 1.2 - 2.2   Bilirubin Total 0.6 0.0 - 1.2 mg/dL   Alkaline Phosphatase 72 48 - 121 IU/L   LDH 128 121 - 224 IU/L   AST 15 0 - 40 IU/L   ALT 14 0 - 44 IU/L   GGT 19 0 - 65 IU/L   Iron 68 38 - 169 ug/dL   Cholesterol, Total 188 100 - 199 mg/dL   Triglycerides 98 0 - 149 mg/dL   HDL 41 >39 mg/dL   VLDL Cholesterol Cal 18 5 - 40 mg/dL   LDL Chol Calc (NIH) 129 (H) 0 - 99 mg/dL   Chol/HDL Ratio 4.6 0.0 - 5.0 ratio    Comment:                                   T. Chol/HDL Ratio                                             Men  Women                               1/2 Avg.Risk  3.4    3.3                                   Avg.Risk  5.0    4.4                                2X Avg.Risk  9.6    7.1                                3X Avg.Risk 23.4   11.0    Estimated CHD Risk 0.9 0.0 - 1.0 times avg.    Comment: The CHD Risk is based on the T. Chol/HDL ratio. Other factors affect CHD Risk such as hypertension, smoking, diabetes, severe obesity, and family history of premature CHD.    TSH 5.580 (H) 0.450 - 4.500 uIU/mL   T4, Total 7.0 4.5 - 12.0 ug/dL   T3 Uptake Ratio 27 24 - 39 %   Free Thyroxine Index 1.9 1.2 -  4.9   Prostate Specific Ag, Serum 0.7 0.0 - 4.0 ng/mL    Comment: Roche ECLIA methodology. According to the American Urological Association, Serum PSA should decrease and remain at undetectable levels after radical prostatectomy. The AUA defines biochemical recurrence as an initial PSA value 0.2 ng/mL or greater followed by a subsequent confirmatory PSA value 0.2 ng/mL or greater. Values obtained with different  assay methods or kits cannot be used interchangeably. Results cannot be interpreted as absolute evidence of the presence or absence of malignant disease.    WBC 5.7 3.4 - 10.8 x10E3/uL   RBC 5.13 4.14 - 5.80 x10E6/uL   Hemoglobin 15.9 13.0 - 17.7 g/dL   Hematocrit 46.6 37.5 - 51.0 %   MCV 91 79 - 97 fL   MCH 31.0 26.6 - 33.0 pg   MCHC 34.1 31 - 35 g/dL   RDW 12.4 11.6 - 15.4 %   Platelets 234 150 - 450 x10E3/uL   Neutrophils 52 Not Estab. %   Lymphs 28 Not Estab. %   Monocytes 11 Not Estab. %   Eos 8 Not Estab. %   Basos 1 Not Estab. %   Neutrophils Absolute 3.0 1 - 7 x10E3/uL   Lymphocytes Absolute 1.6 0 - 3 x10E3/uL   Monocytes Absolute 0.7 0 - 0 x10E3/uL   EOS (ABSOLUTE) 0.5 (H) 0.0 - 0.4 x10E3/uL   Basophils Absolute 0.1 0 - 0 x10E3/uL   Immature Granulocytes 0 Not Estab. %   Immature Grans (Abs) 0.0 0.0 - 0.1 x10E3/uL    DIFFERENTIAL DIAGNOSIS  Annual physical examination, thoracic aneurysm, abnormal  TSH  ASSESSMENT AND PLAN  Annual physical examination, history of thoracic aneurysm, abnormal TSH   Plan: The patient had presented for annual physical examination. Patient's labs did reveal abnormal TSH but normal remainder of the thyroid panel of uncertain etiology.  He has not had any symptoms of hypothyroidism.  We will have him follow-up in 3 months for recheck.  In addition his thoracic aneurysm has not been checked in 3 years and he will be referred to thoracic surgery for evaluation and repeat CT imaging  Lenise Arena MD    Note: This note was generated in part or whole with voice recognition software. Voice recognition is usually quite accurate but there are transcription errors that can and very often do occur. I apologize for any typographical errors that were not detected and corrected.

## 2019-12-31 NOTE — Addendum Note (Signed)
Addended by: Aliene Altes on: 12/31/2019 11:41 AM   Modules accepted: Orders

## 2020-01-02 ENCOUNTER — Ambulatory Visit (INDEPENDENT_AMBULATORY_CARE_PROVIDER_SITE_OTHER): Payer: 59 | Admitting: Urology

## 2020-01-02 ENCOUNTER — Other Ambulatory Visit: Payer: Self-pay

## 2020-01-02 ENCOUNTER — Encounter: Payer: Self-pay | Admitting: Urology

## 2020-01-02 VITALS — BP 141/83 | HR 80 | Ht 71.0 in | Wt 241.0 lb

## 2020-01-02 DIAGNOSIS — N35911 Unspecified urethral stricture, male, meatal: Secondary | ICD-10-CM | POA: Diagnosis not present

## 2020-01-02 DIAGNOSIS — N390 Urinary tract infection, site not specified: Secondary | ICD-10-CM

## 2020-01-02 LAB — URINALYSIS, COMPLETE
Bilirubin, UA: NEGATIVE
Glucose, UA: NEGATIVE
Ketones, UA: NEGATIVE
Nitrite, UA: NEGATIVE
Protein,UA: NEGATIVE
Specific Gravity, UA: 1.02 (ref 1.005–1.030)
Urobilinogen, Ur: 0.2 mg/dL (ref 0.2–1.0)
pH, UA: 6 (ref 5.0–7.5)

## 2020-01-02 LAB — MICROSCOPIC EXAMINATION
Bacteria, UA: NONE SEEN
Epithelial Cells (non renal): NONE SEEN /hpf (ref 0–10)

## 2020-01-02 NOTE — Progress Notes (Signed)
01/02/2020 11:31 AM   Arthur Koch, Arthur Koch [id number]  Referring provider: Frederica Kuster, PA-C [place],  [place] [address]  Chief Complaint  Patient presents with  . Recurrent UTI    Urologic history: 1.  Meatal stricture -Meatal dilation and penile biopsy 04/27/2017 -Biopsy with inflammatory change and no BXO, carcinoma or CIS   HPI: 64 y.o. male presents for follow-up.   Last visit 05/11/2017; meatal dilator at that visit was recommended however he states he was unable to perform  Seen [facility] 12/25/2019 complaining 2-week history of dysuria and urgency with malodorous urine  No fever or chills  UA cloudy, nitrite positive, >50 WBC/4-10 RBC  Treated Cipro 5 mg bid x14 days  Urine culture grew >100,000 and E. coli sensitive to Cipro  Symptoms have resolved   PMH: Past Medical History:  Diagnosis Date  . Decreased hearing   . Diverticulitis   . ED (erectile dysfunction)   . Family history of Parkinson disease   . Family hx of prostate cancer   . GERD (gastroesophageal reflux disease)   . Gout   . History of urethral stricture   . Hypertension   . Ileus ([place])   . Inguinal hernia, right   . Overweight   . Prediabetes   . Rib fractures Koch/2018   FX 8 RIBS-FELL OFF A LADDER  . Sleep apnea   . Thoracic aortic aneurysm ([place]) 05/11/2017   4.4cm see on CT    Surgical History: Past Surgical History:  Procedure Laterality Date  . CYSTOSCOPY WITH URETHRAL DILATATION N/A 04/27/2017   Procedure: CYSTOSCOPY WITH MEATAL DILATION;  Surgeon: Abbie Sons, MD;  Location: [facility] ORS;  Service: Urology;  Laterality: N/A;  . PENILE BIOPSY N/A 04/27/2017   Procedure: PENILE BIOPSY;  Surgeon: Abbie Sons, MD;  Location: [facility] OR[facility];  Service: Urology;  Laterality: N/A;  . URETHRA SURGERY      Home Medications:  Allergies as of 01/02/2020   No Known Allergies     Medication List       Accurate as of January 02, 2020 11:31 AM. If you have any questions, ask your nurse or doctor.        aspirin EC 81 MG tablet Take 81 mg by mouth daily.   B COMPLEX PO Take 1 tablet daily by mouth. CURRENTLY OUT   ciprofloxacin 500 MG tablet Commonly known as: CIPRO Take 500 mg by mouth 2 (two) times daily.   Fish Oil 1000 MG Caps Take 1,000 mg daily by mouth. CURRENTLY OUT   lisinopril-hydrochlorothiazide 20-12.5 MG tablet Commonly known as: ZESTORETIC Take 1 tablet by mouth daily.   pantoprazole 40 MG tablet Commonly known as: PROTONIX Take 1 tablet (40 mg total) by mouth daily after lunch.   sildenafil 25 MG tablet Commonly known as: Viagra Take 1 tablet (25 mg total) by mouth daily as needed for erectile dysfunction.       Allergies: No Known Allergies  Family History: Family History  Problem Relation Age of Onset  . Prostate cancer Brother   . Parkinson's disease Father   . Hypertension Mother     Social History:  reports that he quit smoking about 36 years ago. His smoking use included cigarettes. He has a 1.00 pack-year smoking history. He has quit using smokeless tobacco. He reports current alcohol use of about 4.0 standard drinks of alcohol per week. He reports that he does not use drugs.   Physical  Exam: BP (!) 141/83   Pulse 80   Ht 5\' 11"  (1.803 m)   Wt (!) 241 lb (109.3 kg)   BMI 33.61 kg/m   Constitutional:  Alert and oriented, No acute distress. HEENT: [place] AT, moist mucus membranes.  Trachea midline, no masses. Cardiovascular: No clubbing, cyanosis, or edema. Respiratory: Normal respiratory effort, no increased work of breathing. GU: Small caliber urethral meatus, no penile lesions Skin: No rashes, bruises or suspicious lesions. Neurologic: Grossly intact, no focal deficits, moving all 4 extremities. Psychiatric: Normal mood and affect.   Assessment & Plan:    1.  Recurrent meatal stricture  He was unsuccessful in performing meatal dilation  We discussed the  high likelihood of recurrent stricture with repeat dilation  Recommended evaluation Dr. Francesca Jewett at [facility] to consider meatoplasty.  He was agreeable with this plan  2.  Urinary tract infection  Urinalysis today was clear  Complete antibiotic course   Abbie Sons, MD  [place] [address], [place], [place] [address] [phone]

## 2020-01-07 DIAGNOSIS — G4733 Obstructive sleep apnea (adult) (pediatric): Secondary | ICD-10-CM | POA: Diagnosis not present

## 2020-01-14 ENCOUNTER — Telehealth: Payer: Self-pay

## 2020-01-14 ENCOUNTER — Other Ambulatory Visit: Payer: Self-pay

## 2020-01-14 DIAGNOSIS — I77810 Thoracic aortic ectasia: Secondary | ICD-10-CM

## 2020-01-14 DIAGNOSIS — I712 Thoracic aortic aneurysm, without rupture, unspecified: Secondary | ICD-10-CM

## 2020-01-14 NOTE — Telephone Encounter (Signed)
Message left for the patient. He will need imaging done prior to being seen by Dr Genevive Bi. CT Angio per Dr Genevive Bi. He is scheduled to see him for this Friday and this appointment will need to be rescheduled.

## 2020-01-15 NOTE — Telephone Encounter (Signed)
When calling pt for reminder call, I let the patient know he would need a CT before coming in will you r/s patients CT and let him know. Please call patient patient and advise.

## 2020-01-15 NOTE — Telephone Encounter (Signed)
Message left for the patient.   The patient is scheduled for a CT Angio at [place] on 02/20/20 at 1:00 pm. He is to arrive there by 12:45 pm and have liquids only for 4 hours prior.  He will need to be seen by Dr Genevive Bi the next Friday, this needs to be scheduled.

## 2020-01-16 ENCOUNTER — Ambulatory Visit: Payer: BLUE CROSS/BLUE SHIELD | Admitting: Cardiothoracic Surgery

## 2020-01-22 NOTE — Telephone Encounter (Signed)
Spoke with patient made aware of his appointment on 02/20/20 at 1:00pm with patient instructions. Patient was also made aware of follow up appointment with Dr.Oaks on 02/27/20 at 11 o'clock. Patient verbalized understanding and has no further questions.

## 2020-01-26 ENCOUNTER — Ambulatory Visit (INDEPENDENT_AMBULATORY_CARE_PROVIDER_SITE_OTHER): Payer: 59 | Admitting: Dermatology

## 2020-01-26 ENCOUNTER — Other Ambulatory Visit: Payer: Self-pay

## 2020-01-26 DIAGNOSIS — I872 Venous insufficiency (chronic) (peripheral): Secondary | ICD-10-CM

## 2020-01-26 DIAGNOSIS — D229 Melanocytic nevi, unspecified: Secondary | ICD-10-CM | POA: Diagnosis not present

## 2020-01-26 DIAGNOSIS — Z1283 Encounter for screening for malignant neoplasm of skin: Secondary | ICD-10-CM | POA: Diagnosis not present

## 2020-01-26 DIAGNOSIS — L820 Inflamed seborrheic keratosis: Secondary | ICD-10-CM | POA: Diagnosis not present

## 2020-01-26 DIAGNOSIS — L821 Other seborrheic keratosis: Secondary | ICD-10-CM | POA: Diagnosis not present

## 2020-01-26 DIAGNOSIS — L570 Actinic keratosis: Secondary | ICD-10-CM | POA: Diagnosis not present

## 2020-01-26 DIAGNOSIS — D1800 Hemangioma unspecified site: Secondary | ICD-10-CM | POA: Diagnosis not present

## 2020-01-26 DIAGNOSIS — L578 Other skin changes due to chronic exposure to nonionizing radiation: Secondary | ICD-10-CM

## 2020-01-26 DIAGNOSIS — D692 Other nonthrombocytopenic purpura: Secondary | ICD-10-CM | POA: Diagnosis not present

## 2020-01-26 DIAGNOSIS — B079 Viral wart, unspecified: Secondary | ICD-10-CM | POA: Diagnosis not present

## 2020-01-26 DIAGNOSIS — L814 Other melanin hyperpigmentation: Secondary | ICD-10-CM | POA: Diagnosis not present

## 2020-01-26 NOTE — Patient Instructions (Addendum)
Recommend daily broad spectrum sunscreen SPF 30+ to sun-exposed areas, reapply every 2 hours as needed. Call for new or changing lesions.  Cryotherapy Aftercare  . Wash gently with soap and water everyday.   . Apply Vaseline and Band-Aid daily until healed.  Prior to procedure, discussed risks of blister formation, small wound, skin dyspigmentation, or rare scar following cryotherapy.  Liquid nitrogen was applied for 10-12 seconds to the skin lesion and the expected blistering or scabbing reaction explained. Do not pick at the area. Patient reminded to expect hypopigmented scars from the procedure. Return if lesion fails to fully resolve.  

## 2020-01-26 NOTE — Progress Notes (Signed)
Follow-Up Visit   Subjective  Arthur Koch is a 64 y.o. male who presents for the following: TBSE. The patient presents for Total-Body Skin Exam (TBSE) for skin cancer screening and mole check. Patient presents today for annual TBSE, has no areas of concern or skin cancer hx  The following portions of the chart were reviewed this encounter and updated as appropriate:  Tobacco  Allergies  Meds  Problems  Med Hx  Surg Hx  Fam Hx     Review of Systems:  No other skin or systemic complaints except as noted in HPI or Assessment and Plan.  Objective  Well appearing patient in no apparent distress; mood and affect are within normal limits.  A full examination was performed including scalp, head, eyes, ears, nose, lips, neck, chest, axillae, abdomen, back, buttocks, bilateral upper extremities, bilateral lower extremities, hands, feet, fingers, toes, fingernails, and toenails. All findings within normal limits unless otherwise noted below.  Objective  right arm: Violaceous macules and patches.   Objective  Right Face, B/L ears x 9 (9): Erythematous thin papules/macules with gritty scale.   Objective  Mid Back: Erythematous keratotic or waxy stuck-on papule or plaque.   Objective  Right Knee: Verrucous papules -- Discussed viral etiology and contagion.   Objective  Right Lower Leg - Anterior: Erythematous, scaly patches involving the ankle and distal lower leg with associated lower leg edema.    Assessment & Plan  Senile purpura (HCC) right arm  Benign, observe.    AK (actinic keratosis) (9) Right Face, B/L ears x 9  Cryotherapy today Prior to procedure, discussed risks of blister formation, small wound, skin dyspigmentation, or rare scar following cryotherapy.    Destruction of lesion - Right Face, B/L ears x 9 Complexity: simple   Destruction method: cryotherapy   Informed consent: discussed and consent obtained   Timeout:  patient name, date of birth,  surgical site, and procedure verified Lesion destroyed using liquid nitrogen: Yes   Region frozen until ice ball extended beyond lesion: Yes   Outcome: patient tolerated procedure well with no complications   Post-procedure details: wound care instructions given    Inflamed seborrheic keratosis Mid Back  Cryotherapy today Prior to procedure, discussed risks of blister formation, small wound, skin dyspigmentation, or rare scar following cryotherapy.    Destruction of lesion - Mid Back Complexity: simple   Destruction method: cryotherapy   Informed consent: discussed and consent obtained   Timeout:  patient name, date of birth, surgical site, and procedure verified Lesion destroyed using liquid nitrogen: Yes   Region frozen until ice ball extended beyond lesion: Yes   Outcome: patient tolerated procedure well with no complications   Post-procedure details: wound care instructions given    Viral warts, unspecified type Right Knee  Cryotherapy today Prior to procedure, discussed risks of blister formation, small wound, skin dyspigmentation, or rare scar following cryotherapy.    Destruction of lesion - Right Knee Complexity: simple   Destruction method: cryotherapy   Informed consent: discussed and consent obtained   Timeout:  patient name, date of birth, surgical site, and procedure verified Lesion destroyed using liquid nitrogen: Yes   Region frozen until ice ball extended beyond lesion: Yes   Outcome: patient tolerated procedure well with no complications   Post-procedure details: wound care instructions given    Stasis dermatitis of both legs Right Lower Leg - Anterior  Benign-appearing.  Observation.  Call clinic for new or changing areas on skin.  Recommend  daily use of broad spectrum spf 30+ sunscreen to sun-exposed areas.     Lentigines - Scattered tan macules - Discussed due to sun exposure - Benign, observe - Call for any changes  Seborrheic Keratoses -  Stuck-on, waxy, tan-brown papules and plaques  - Discussed benign etiology and prognosis. - Observe - Call for any changes  Melanocytic Nevi - Tan-brown and/or pink-flesh-colored symmetric macules and papules - Benign appearing on exam today - Observation - Call clinic for new or changing moles - Recommend daily use of broad spectrum spf 30+ sunscreen to sun-exposed areas.   Hemangiomas - Red papules - Discussed benign nature - Observe - Call for any changes  Actinic Damage - diffuse scaly erythematous macules with underlying dyspigmentation - Recommend daily broad spectrum sunscreen SPF 30+ to sun-exposed areas, reapply every 2 hours as needed.  - Call for new or changing lesions.  Skin cancer screening performed today.   Return in about 6 months (around 07/28/2020) for  AK Follow up.  Marene Lenz, CMA, am acting as scribe for Sarina Ser, MD . Documentation: I have reviewed the above documentation for accuracy and completeness, and I agree with the above.  Sarina Ser, MD

## 2020-01-29 ENCOUNTER — Encounter: Payer: Self-pay | Admitting: Dermatology

## 2020-02-03 DIAGNOSIS — U071 COVID-19: Secondary | ICD-10-CM | POA: Diagnosis not present

## 2020-02-03 DIAGNOSIS — Z7189 Other specified counseling: Secondary | ICD-10-CM | POA: Diagnosis not present

## 2020-02-03 DIAGNOSIS — Z20822 Contact with and (suspected) exposure to covid-19: Secondary | ICD-10-CM | POA: Diagnosis not present

## 2020-02-03 DIAGNOSIS — Z03818 Encounter for observation for suspected exposure to other biological agents ruled out: Secondary | ICD-10-CM | POA: Diagnosis not present

## 2020-02-07 DIAGNOSIS — G4733 Obstructive sleep apnea (adult) (pediatric): Secondary | ICD-10-CM | POA: Diagnosis not present

## 2020-02-20 ENCOUNTER — Other Ambulatory Visit: Payer: Self-pay

## 2020-02-20 ENCOUNTER — Ambulatory Visit
Admission: RE | Admit: 2020-02-20 | Discharge: 2020-02-20 | Disposition: A | Discharge status: Home / Self Care | Payer: 59 | Source: Ambulatory Visit | Attending: Cardiothoracic Surgery | Admitting: Cardiothoracic Surgery

## 2020-02-20 DIAGNOSIS — I712 Thoracic aortic aneurysm, without rupture, unspecified: Secondary | ICD-10-CM

## 2020-02-20 DIAGNOSIS — I77810 Thoracic aortic ectasia: Secondary | ICD-10-CM | POA: Insufficient documentation

## 2020-02-20 DIAGNOSIS — R918 Other nonspecific abnormal finding of lung field: Secondary | ICD-10-CM | POA: Diagnosis not present

## 2020-02-20 LAB — POCT I-STAT CREATININE: Creatinine, Ser: 1.3 mg/dL — ABNORMAL HIGH (ref 0.61–1.24)

## 2020-02-20 MED ORDER — IOHEXOL 350 MG/ML SOLN
75.0000 mL | Freq: Once | INTRAVENOUS | Status: AC | PRN
  Administered 2020-02-20: 75 mL via INTRAVENOUS

## 2020-02-23 DIAGNOSIS — N35811 Other urethral stricture, male, meatal: Secondary | ICD-10-CM | POA: Diagnosis not present

## 2020-02-27 ENCOUNTER — Encounter: Payer: Self-pay | Admitting: Cardiothoracic Surgery

## 2020-02-27 ENCOUNTER — Other Ambulatory Visit: Payer: Self-pay

## 2020-02-27 ENCOUNTER — Ambulatory Visit: Payer: Self-pay | Admitting: Cardiothoracic Surgery

## 2020-02-27 ENCOUNTER — Ambulatory Visit (INDEPENDENT_AMBULATORY_CARE_PROVIDER_SITE_OTHER): Payer: 59 | Admitting: Cardiothoracic Surgery

## 2020-02-27 VITALS — BP 131/81 | HR 75 | Temp 98.3°F | Ht 71.5 in | Wt 246.0 lb

## 2020-02-27 DIAGNOSIS — I77810 Thoracic aortic ectasia: Secondary | ICD-10-CM

## 2020-02-27 NOTE — Patient Instructions (Addendum)
Have your blood pressure checked and be sure to follow up if you have any breathing problems. We can refer you to Pulmonary for this.

## 2020-02-27 NOTE — Progress Notes (Signed)
Arthur Koch Follow Up Note  Patient ID: Arthur Koch, male   DOB: 1955/09/25, 64 y.o.   MRN: [id number]  HISTORY: He returns today in follow-up.  He was last seen 3 years ago but declined any additional follow-up until now.  He recently saw a physician at the [place] office and a repeat chest CT was recommended.  He had that made.  There is no change in the 4.4 cm ascending aortic aneurysm.  The patient states that he did have Covid a couple months ago.  He is recovered completely from that.    Vitals:   02/27/20 0928  BP: 131/81  Pulse: 75  Temp: 98.3 F (36.8 C)  SpO2: 95%     EXAM:  Resp: Lungs show scattered rhonchi throughout..  No respiratory distress, normal effort. Heart:  Regular without murmurs Abd:  Abdomen is soft, non distended and non tender. No masses are palpable.  There is no rebound and no guarding.  Neurological: Alert and oriented to person, place, and time. Coordination normal.  Skin: Skin is warm and dry. No rash noted. No diaphoretic. No erythema. No pallor.  Psychiatric: Normal mood and affect. Normal behavior. Judgment and thought content normal.      ASSESSMENT: I have independently reviewed the patient's CT scan.  I do not see any change in the ascending thoracic aorta.  There are bilateral pulmonary infiltrates that may correspond to his recent Covid infection.   PLAN:   I offered him a referral to our pulmonologist and I also recommended that he get a repeat chest CT in 1 year.  He states that because he is uninsured he will decline any further intervention.  I explained to him the importance of monitoring both his lung disease and his aorta.  He understands.  He will contact us if he wishes any further follow-up.    Nestor Lewandowsky, MD

## 2020-03-25 NOTE — Progress Notes (Signed)
Physical 01/08/20 with Dr. Lenise Arena. Plan: The patient had presented for annual physical examination. Patient's labs did reveal abnormal TSH but normal remainder of the thyroid panel of uncertain etiology.  He has not had any symptoms of hypothyroidism.  We will have him follow-up in 3 months for recheck.

## 2020-03-26 ENCOUNTER — Other Ambulatory Visit: Payer: Self-pay

## 2020-03-26 DIAGNOSIS — R7989 Other specified abnormal findings of blood chemistry: Secondary | ICD-10-CM

## 2020-03-27 LAB — THYROID PANEL WITH TSH
Free Thyroxine Index: 2 (ref 1.2–4.9)
T3 Uptake Ratio: 27 % (ref 24–39)
T4, Total: 7.3 ug/dL (ref 4.5–12.0)
TSH: 4.45 u[IU]/mL (ref 0.450–4.500)

## 2020-03-29 DIAGNOSIS — Z01812 Encounter for preprocedural laboratory examination: Secondary | ICD-10-CM | POA: Diagnosis not present

## 2020-03-29 DIAGNOSIS — Z20822 Contact with and (suspected) exposure to covid-19: Secondary | ICD-10-CM | POA: Diagnosis not present

## 2020-03-30 DIAGNOSIS — N35811 Other urethral stricture, male, meatal: Secondary | ICD-10-CM | POA: Diagnosis not present

## 2020-03-30 DIAGNOSIS — Z7982 Long term (current) use of aspirin: Secondary | ICD-10-CM | POA: Diagnosis not present

## 2020-03-30 DIAGNOSIS — E669 Obesity, unspecified: Secondary | ICD-10-CM | POA: Diagnosis not present

## 2020-03-30 DIAGNOSIS — Z87891 Personal history of nicotine dependence: Secondary | ICD-10-CM | POA: Diagnosis not present

## 2020-03-30 DIAGNOSIS — K219 Gastro-esophageal reflux disease without esophagitis: Secondary | ICD-10-CM | POA: Diagnosis not present

## 2020-03-30 DIAGNOSIS — I10 Essential (primary) hypertension: Secondary | ICD-10-CM | POA: Diagnosis not present

## 2020-05-17 DIAGNOSIS — N35811 Other urethral stricture, male, meatal: Secondary | ICD-10-CM | POA: Diagnosis not present

## 2020-05-17 DIAGNOSIS — Z87891 Personal history of nicotine dependence: Secondary | ICD-10-CM | POA: Diagnosis not present

## 2020-05-17 DIAGNOSIS — Z125 Encounter for screening for malignant neoplasm of prostate: Secondary | ICD-10-CM | POA: Diagnosis not present

## 2020-06-04 DIAGNOSIS — H5203 Hypermetropia, bilateral: Secondary | ICD-10-CM | POA: Diagnosis not present

## 2020-07-28 ENCOUNTER — Ambulatory Visit: Payer: 59 | Admitting: Dermatology

## 2020-08-04 ENCOUNTER — Other Ambulatory Visit: Payer: Self-pay

## 2020-08-04 DIAGNOSIS — I10 Essential (primary) hypertension: Secondary | ICD-10-CM

## 2020-08-04 MED ORDER — LISINOPRIL-HYDROCHLOROTHIAZIDE 20-12.5 MG PO TABS: 1.0000 | ORAL_TABLET | Freq: Every day | ORAL | 1 refills | Status: DC

## 2020-08-27 ENCOUNTER — Other Ambulatory Visit: Payer: Self-pay

## 2020-08-27 DIAGNOSIS — N529 Male erectile dysfunction, unspecified: Secondary | ICD-10-CM

## 2020-08-27 MED ORDER — SILDENAFIL CITRATE 25 MG PO TABS: ORAL_TABLET | ORAL | 3 refills | Status: DC

## 2020-10-01 DIAGNOSIS — G4733 Obstructive sleep apnea (adult) (pediatric): Secondary | ICD-10-CM | POA: Diagnosis not present

## 2020-10-31 DIAGNOSIS — G4733 Obstructive sleep apnea (adult) (pediatric): Secondary | ICD-10-CM | POA: Diagnosis not present

## 2020-12-01 DIAGNOSIS — G4733 Obstructive sleep apnea (adult) (pediatric): Secondary | ICD-10-CM | POA: Diagnosis not present

## 2021-01-21 ENCOUNTER — Other Ambulatory Visit: Payer: Self-pay

## 2021-01-21 DIAGNOSIS — K219 Gastro-esophageal reflux disease without esophagitis: Secondary | ICD-10-CM

## 2021-01-21 MED ORDER — PANTOPRAZOLE SODIUM 40 MG PO TBEC: 40.0000 mg | DELAYED_RELEASE_TABLET | Freq: Every day | ORAL | 5 refills | Status: DC

## 2021-02-04 ENCOUNTER — Other Ambulatory Visit: Payer: Self-pay

## 2021-02-04 DIAGNOSIS — I10 Essential (primary) hypertension: Secondary | ICD-10-CM

## 2021-02-04 MED ORDER — LISINOPRIL-HYDROCHLOROTHIAZIDE 20-12.5 MG PO TABS: 1.0000 | ORAL_TABLET | Freq: Every day | ORAL | 3 refills | Status: DC

## 2021-04-22 DIAGNOSIS — U071 COVID-19: Secondary | ICD-10-CM | POA: Diagnosis not present

## 2021-04-22 DIAGNOSIS — Z03818 Encounter for observation for suspected exposure to other biological agents ruled out: Secondary | ICD-10-CM | POA: Diagnosis not present

## 2021-05-10 NOTE — Progress Notes (Signed)
05/18/21 annual physical scheduled. Reviewed CDC recommendations for importance of HIV/ Hep C screening once in lifetime. Patient has agreed for Hep C.

## 2021-05-11 ENCOUNTER — Other Ambulatory Visit: Payer: Self-pay

## 2021-05-11 ENCOUNTER — Ambulatory Visit: Payer: Self-pay

## 2021-05-11 DIAGNOSIS — Z0000 Encounter for general adult medical examination without abnormal findings: Secondary | ICD-10-CM

## 2021-05-11 DIAGNOSIS — Z23 Encounter for immunization: Secondary | ICD-10-CM

## 2021-05-11 LAB — POCT URINALYSIS DIPSTICK
Bilirubin, UA: NEGATIVE
Blood, UA: NEGATIVE
Glucose, UA: NEGATIVE
Ketones, UA: NEGATIVE
Leukocytes, UA: NEGATIVE
Nitrite, UA: NEGATIVE
Protein, UA: NEGATIVE
Spec Grav, UA: <=1.005 — AB (ref 1.010–1.025)
Urobilinogen, UA: 0.2 E.U./dL
pH, UA: 7 (ref 5.0–8.0)

## 2021-05-12 ENCOUNTER — Encounter: Payer: 59 | Admitting: Physician Assistant

## 2021-05-12 LAB — CMP12+LP+TP+TSH+6AC+PSA+CBC…
ALT: 23 IU/L (ref 0–44)
AST: 24 IU/L (ref 0–40)
Albumin/Globulin Ratio: 1.7 (ref 1.2–2.2)
Albumin: 4.4 g/dL (ref 3.8–4.8)
Alkaline Phosphatase: 58 IU/L (ref 44–121)
BUN/Creatinine Ratio: 14 (ref 10–24)
BUN: 16 mg/dL (ref 8–27)
Basophils Absolute: 0.1 10*3/uL (ref 0.0–0.2)
Basos: 1 %
Bilirubin Total: 1.1 mg/dL (ref 0.0–1.2)
Calcium: 9.6 mg/dL (ref 8.6–10.2)
Chloride: 99 mmol/L (ref 96–106)
Chol/HDL Ratio: 5.7 ratio — ABNORMAL HIGH (ref 0.0–5.0)
Cholesterol, Total: 211 mg/dL — ABNORMAL HIGH (ref 100–199)
Creatinine, Ser: 1.18 mg/dL (ref 0.76–1.27)
EOS (ABSOLUTE): 0.4 10*3/uL (ref 0.0–0.4)
Eos: 6 %
Estimated CHD Risk: 1.2 times avg. — ABNORMAL HIGH (ref 0.0–1.0)
Free Thyroxine Index: 2.2 (ref 1.2–4.9)
GGT: 17 IU/L (ref 0–65)
Globulin, Total: 2.6 g/dL (ref 1.5–4.5)
Glucose: 94 mg/dL (ref 70–99)
HDL: 37 mg/dL — ABNORMAL LOW (ref >39)
Hematocrit: 47.5 % (ref 37.5–51.0)
Hemoglobin: 16.6 g/dL (ref 13.0–17.7)
Immature Grans (Abs): 0 10*3/uL (ref 0.0–0.1)
Immature Granulocytes: 1 %
Iron: 117 ug/dL (ref 38–169)
LDH: 145 IU/L (ref 121–224)
LDL Chol Calc (NIH): 142 mg/dL — ABNORMAL HIGH (ref 0–99)
Lymphocytes Absolute: 1.3 10*3/uL (ref 0.7–3.1)
Lymphs: 24 %
MCH: 31.9 pg (ref 26.6–33.0)
MCHC: 34.9 g/dL (ref 31.5–35.7)
MCV: 91 fL (ref 79–97)
Monocytes Absolute: 0.8 10*3/uL (ref 0.1–0.9)
Monocytes: 15 %
Neutrophils Absolute: 2.9 10*3/uL (ref 1.4–7.0)
Neutrophils: 53 %
Phosphorus: 3.9 mg/dL (ref 2.8–4.1)
Platelets: 197 10*3/uL (ref 150–450)
Potassium: 4.5 mmol/L (ref 3.5–5.2)
Prostate Specific Ag, Serum: 0.2 ng/mL (ref 0.0–4.0)
RBC: 5.21 x10E6/uL (ref 4.14–5.80)
RDW: 12 % (ref 11.6–15.4)
Sodium: 137 mmol/L (ref 134–144)
T3 Uptake Ratio: 29 % (ref 24–39)
T4, Total: 7.7 ug/dL (ref 4.5–12.0)
TSH: 2.59 u[IU]/mL (ref 0.450–4.500)
Total Protein: 7 g/dL (ref 6.0–8.5)
Triglycerides: 176 mg/dL — ABNORMAL HIGH (ref 0–149)
Uric Acid: 8.3 mg/dL (ref 3.8–8.4)
VLDL Cholesterol Cal: 32 mg/dL (ref 5–40)
WBC: 5.5 10*3/uL (ref 3.4–10.8)
eGFR: 69 mL/min/{1.73_m2} (ref >59)

## 2021-05-12 LAB — HCV AB W REFLEX TO QUANT PCR: HCV Ab: <0.1 s/co ratio (ref 0.0–0.9)

## 2021-05-12 LAB — HCV INTERPRETATION

## 2021-05-18 ENCOUNTER — Ambulatory Visit: Payer: Self-pay | Admitting: Physician Assistant

## 2021-05-18 ENCOUNTER — Encounter: Payer: Self-pay | Admitting: Physician Assistant

## 2021-05-18 ENCOUNTER — Other Ambulatory Visit: Payer: Self-pay

## 2021-05-18 VITALS — BP 134/88 | HR 62 | Temp 97.5°F | Resp 12 | Ht 71.0 in | Wt 262.0 lb

## 2021-05-18 DIAGNOSIS — N529 Male erectile dysfunction, unspecified: Secondary | ICD-10-CM

## 2021-05-18 DIAGNOSIS — Z0000 Encounter for general adult medical examination without abnormal findings: Secondary | ICD-10-CM

## 2021-05-18 MED ORDER — SILDENAFIL CITRATE 25 MG PO TABS: ORAL_TABLET | ORAL | 3 refills | Status: AC

## 2021-05-18 NOTE — Progress Notes (Signed)
   Subjective: Annual physical exam    Patient ID: Arthur Koch, male    DOB: 26-Jun-1955, 65 y.o.   MRN: [id number]  HPI Patient presents for annual physical exam versus concern for intermittent numbness to the right foot.  Denies loss of function.  Patient has mild distention of the ascending aorta.   Review of Systems Erectile dysfunction, hypertension, and sleep apnea    Objective:   Physical Exam  No acute distress.  Temperature 97.5, pulse 62, respiration 12, BP is 134/88, and patient 97% O2 sat on room air.  Patient weighs 260 pounds and BMI is 36.54. HEENT is unremarkable.  Neck is supple without lymphadenopathy or bruits.  Lungs are clear to auscultation.  Heart regular rate and rhythm.  No acute findings on EKG. Abdomen with negative HSM, normoactive bowel sounds, soft, nontender to palpation. No obvious cyst deformity of the upper or lower extremities.  Patient has full and equal range of motion of the upper and lower extremities. No cervical or lumbar spine deformity.  Patient has full and equal range of motion of cervical lumbar spine. Cranial nerves II through XII are grossly intact.  DTR 2+ without clonus.      Assessment & Plan: Well exam.   Discussed lab results with patient showing mild increase of cholesterol.  Patient's is amenable 36 months of conservative measures consistent diet and exercise.

## 2021-06-08 DIAGNOSIS — G4733 Obstructive sleep apnea (adult) (pediatric): Secondary | ICD-10-CM | POA: Diagnosis not present

## 2021-07-07 ENCOUNTER — Other Ambulatory Visit: Payer: Self-pay

## 2021-07-07 DIAGNOSIS — Z0283 Encounter for blood-alcohol and blood-drug test: Secondary | ICD-10-CM

## 2021-07-07 NOTE — Progress Notes (Signed)
Presents to [place] for Random DOT Drug Screen & Breath Alcohol Screen.  [facility] Acct #:  [id number] Random Specimen #:  [id number]  AMD

## 2021-07-09 DIAGNOSIS — G4733 Obstructive sleep apnea (adult) (pediatric): Secondary | ICD-10-CM | POA: Diagnosis not present

## 2021-08-09 DIAGNOSIS — G4733 Obstructive sleep apnea (adult) (pediatric): Secondary | ICD-10-CM | POA: Diagnosis not present

## 2021-08-29 ENCOUNTER — Ambulatory Visit: Payer: Self-pay | Admitting: Physician Assistant

## 2021-08-29 ENCOUNTER — Encounter: Payer: Self-pay | Admitting: Physician Assistant

## 2021-08-29 ENCOUNTER — Ambulatory Visit
Admission: RE | Admit: 2021-08-29 | Discharge: 2021-08-29 | Disposition: A | Discharge status: Home / Self Care | Payer: 59 | Source: Ambulatory Visit | Attending: Physician Assistant | Admitting: Physician Assistant

## 2021-08-29 ENCOUNTER — Other Ambulatory Visit: Payer: Self-pay

## 2021-08-29 ENCOUNTER — Ambulatory Visit
Admission: RE | Admit: 2021-08-29 | Discharge: 2021-08-29 | Disposition: A | Discharge status: Home / Self Care | Payer: 59 | Attending: Physician Assistant | Admitting: Physician Assistant

## 2021-08-29 VITALS — BP 142/92 | HR 70 | Temp 98.0°F | Resp 16

## 2021-08-29 DIAGNOSIS — M25511 Pain in right shoulder: Secondary | ICD-10-CM | POA: Diagnosis not present

## 2021-08-29 NOTE — Progress Notes (Signed)
DOI:  08/16/2021 ?C/O right shoulder pain, knot on right upper arm & popping ?Rates pain 5 on scale of 0--10 ?States "if I hold it a certain way it doesn't hurt at all" ?"It's been hurting since the 7th, but I though it would go away". ?Has been using muscle rubs & ibuprofen sporadically ?No heat or ice to area. ? ?AMD ?

## 2021-08-29 NOTE — Progress Notes (Signed)
? ?  Subjective: Right shoulder pain  ? ? Patient ID: Arthur Koch, male    DOB: Aug 26, 1955, 66 y.o.   MRN: [id number] ? ?HPI ?Patient complaining of right shoulder pain at the [facility] joint secondary to a overhead lifting incident on 08/16/2021.  Patient states no relief with over-the-counter medication and conservative therapy.  Patient is right-hand dominant.  Denies loss of sensation.  States certain movements such as overhead reaching and abduction increases the pain.  Patient is right-hand dominant. ? ? ?Review of Systems ?Erectile dysfunction, hyperlipidemia, hypertension, and obstructive sleep apnea. ?   ?Objective:  ? Physical Exam ?Temperature is 98, pulse 70, respirations 16, BP is 142/92, and patient 95% O2 sat on room air. ?No obvious deformity to the right shoulder.  Neurovascular intact.  Strength against resistance is 3/5.  Patient has some mild guarding with palpation of the [place] joint. ? ? ? ?   ?Assessment & Plan: Right shoulder pain  ?Further evaluation with x-rays for follow-up in 2 days. ? ?

## 2021-08-31 ENCOUNTER — Encounter: Payer: Self-pay | Admitting: Physician Assistant

## 2021-08-31 ENCOUNTER — Other Ambulatory Visit: Payer: Self-pay

## 2021-08-31 ENCOUNTER — Ambulatory Visit: Payer: Self-pay | Admitting: Physician Assistant

## 2021-08-31 ENCOUNTER — Other Ambulatory Visit: Payer: Self-pay | Admitting: Physician Assistant

## 2021-08-31 DIAGNOSIS — M778 Other enthesopathies, not elsewhere classified: Secondary | ICD-10-CM

## 2021-08-31 MED ORDER — ORPHENADRINE CITRATE ER 100 MG PO TB12: 100.0000 mg | ORAL_TABLET | Freq: Two times a day (BID) | ORAL | 0 refills | Status: DC

## 2021-08-31 MED ORDER — NAPROXEN 500 MG PO TABS: 500.0000 mg | ORAL_TABLET | Freq: Two times a day (BID) | ORAL | Status: DC

## 2021-08-31 NOTE — Progress Notes (Signed)
? ?  Subjective: Right shoulder pain  ? ? Patient ID: Arthur Koch, male    DOB: Nov 23, 1955, 66 y.o.   MRN: [id number] ? ?HPI ?Patient follow-up for right shoulder pain which started on 08/16/2021 secondary to overhead lifting incident.  Patient stated decreased pain secondary to work restrictions at to be seen on 08/29/2021.  Patient is here for reevaluation and x-ray report. ? ? ?Review of Systems ?Erectile dysfunction, hyperlipidemia, hypertension, and obstructive sleep apnea. ?   ?Objective:  ? Physical Exam ?No acute distress.  Patient is right-hand dominant.  Examination of the upper extremities shows no obvious deformity edema or erythema.  Patient has moderate guarding palpation of the lateral humeral head.  No acute findings on x-ray of the right shoulder.  Patient neurovascular intact with decreased range of motion with overhead reaching and by complaint of pain.  Strength against resistance 4/5 in comparison to the left nondominant upper extremity. ? ? ? ?   ?Assessment & Plan: Tendinitis right shoulder  ?Patient return back to light duty with a prescription for naproxen and Norflex for the next 5 days.  Patient return back in 5 days for evaluate for full duties. ? ?

## 2021-09-05 ENCOUNTER — Ambulatory Visit: Payer: Self-pay | Admitting: Physician Assistant

## 2021-09-05 ENCOUNTER — Other Ambulatory Visit: Payer: Self-pay

## 2021-09-05 ENCOUNTER — Encounter: Payer: Self-pay | Admitting: Physician Assistant

## 2021-09-05 VITALS — BP 112/82 | HR 78 | Temp 98.0°F | Resp 16

## 2021-09-05 DIAGNOSIS — M778 Other enthesopathies, not elsewhere classified: Secondary | ICD-10-CM

## 2021-09-05 NOTE — Progress Notes (Signed)
States still using Naproxen & Norflex prescribed at last visit.  States seems like it's helping. ?No pain at present. ? ?AMD ?

## 2021-09-05 NOTE — Progress Notes (Signed)
? ?  Subjective: Right shoulder pain  ? ? Patient ID: Arthur Koch, male    DOB: [birth year], 66 y.o.   MRN: [id number] ? ?HPI ?Patient is follow-up for right shoulder pain secondary to a lifting incident which occurred on 08/16/2021.  Patient states decreased pain.  Continued decreased range of motion with overhead movements.  Patient is currently taking naproxen and Norflex.  Denies loss of sensation.  Patient is right-hand dominant.  Patient is working with restrictions. ? ? ?Review of Systems ?Hyperlipidemia, hypertension, IFG, and mild ascending aorta dilation. ?   ?Objective:  ? Physical Exam ? ?No acute distress.  ?Temperature is 98, pulse 78, respirations 16, BP is 112/82, and patient is 96% O2 sat on room air. ?No obvious deformity to the left shoulder.  No edema or erythema.  No ecchymosis.  Patient has moderate guarding palpation lateral aspect of the humerus and AC joint.  Decreased range of motion with overhead reaching.  Strength is 3/10 in comparison to the left nondominant extremity. ? ? ?   ?Assessment & Plan: Right shoulder tendinitis  ? ?Patient advised continue previous medication.  Continue work restriction for 5 days. ?

## 2021-09-07 ENCOUNTER — Other Ambulatory Visit: Payer: Self-pay

## 2021-09-07 DIAGNOSIS — M778 Other enthesopathies, not elsewhere classified: Secondary | ICD-10-CM

## 2021-09-07 MED ORDER — NAPROXEN 500 MG PO TABS: 500.0000 mg | ORAL_TABLET | Freq: Two times a day (BID) | ORAL | Status: DC

## 2021-09-12 ENCOUNTER — Ambulatory Visit: Payer: Self-pay | Admitting: Physician Assistant

## 2021-09-12 ENCOUNTER — Other Ambulatory Visit: Payer: Self-pay

## 2021-09-12 ENCOUNTER — Encounter: Payer: Self-pay | Admitting: Physician Assistant

## 2021-09-12 DIAGNOSIS — M778 Other enthesopathies, not elsewhere classified: Secondary | ICD-10-CM

## 2021-09-12 MED ORDER — NAPROXEN 500 MG PO TABS: 500.0000 mg | ORAL_TABLET | Freq: Two times a day (BID) | ORAL | 0 refills | Status: DC

## 2021-09-12 MED ORDER — NAPROXEN SODIUM ER 500 MG PO TB24: 500.0000 mg | ORAL_TABLET | Freq: Every day | ORAL | 3 refills | Status: DC

## 2021-09-12 NOTE — Progress Notes (Signed)
Follow up left shoulder pain. Pt states its doing better then at previous visit./CL,RMA ?

## 2021-09-12 NOTE — Progress Notes (Signed)
? ?  Subjective: Right shoulder pain  ? ? Patient ID: Arthur Koch, male    DOB: 11-28-1955, 66 y.o.   MRN: [id number] ? ?HPI ?Patient is follow-up for right shoulder pain secondary to lifting incident which occurred on 08/16/2021.  Patient states decreased pain and increased range of motion secondary to modified duties and anti-inflammatory medications.  Denies loss sensation and request return to full duty. ? ? ?Review of Systems ?Hypertension and obstructive sleep apnea. ?   ?Objective:  ? Physical Exam ? ?See nurses note for vital signs. ?Patient is right-hand dominant.  No obvious deformity, edema, or erythema.  Patient has full and equal range of motion.  Strength against resistance 5/5. ? ? ?   ?Assessment & Plan: Right shoulder tendinitis.  ? ?Patient return back to full duty advised take anti-inflammatory medication as needed for pain.  Return back to clinic condition worsens. ?

## 2021-10-03 ENCOUNTER — Other Ambulatory Visit: Payer: Self-pay

## 2021-10-03 DIAGNOSIS — M778 Other enthesopathies, not elsewhere classified: Secondary | ICD-10-CM

## 2021-10-03 MED ORDER — NAPROXEN 500 MG PO TABS: 500.0000 mg | ORAL_TABLET | Freq: Two times a day (BID) | ORAL | 0 refills | Status: DC

## 2021-10-10 ENCOUNTER — Other Ambulatory Visit: Payer: Self-pay

## 2021-10-10 DIAGNOSIS — M778 Other enthesopathies, not elsewhere classified: Secondary | ICD-10-CM

## 2021-10-10 MED ORDER — NAPROXEN 500 MG PO TABS: 500.0000 mg | ORAL_TABLET | Freq: Two times a day (BID) | ORAL | 0 refills | Status: AC

## 2021-10-17 ENCOUNTER — Other Ambulatory Visit: Payer: Self-pay

## 2021-10-17 DIAGNOSIS — K219 Gastro-esophageal reflux disease without esophagitis: Secondary | ICD-10-CM

## 2021-10-17 MED ORDER — PANTOPRAZOLE SODIUM 40 MG PO TBEC: 40.0000 mg | DELAYED_RELEASE_TABLET | Freq: Every day | ORAL | 3 refills | Status: AC

## 2021-12-30 DIAGNOSIS — G4733 Obstructive sleep apnea (adult) (pediatric): Secondary | ICD-10-CM | POA: Diagnosis not present

## 2022-01-27 ENCOUNTER — Other Ambulatory Visit: Payer: Self-pay

## 2022-01-27 DIAGNOSIS — I10 Essential (primary) hypertension: Secondary | ICD-10-CM

## 2022-01-27 MED ORDER — LISINOPRIL-HYDROCHLOROTHIAZIDE 20-12.5 MG PO TABS: 1.0000 | ORAL_TABLET | Freq: Every day | ORAL | 3 refills | Status: AC

## 2022-01-30 DIAGNOSIS — G4733 Obstructive sleep apnea (adult) (pediatric): Secondary | ICD-10-CM | POA: Diagnosis not present

## 2022-03-02 DIAGNOSIS — G4733 Obstructive sleep apnea (adult) (pediatric): Secondary | ICD-10-CM | POA: Diagnosis not present

## 2022-03-15 DIAGNOSIS — M9902 Segmental and somatic dysfunction of thoracic region: Secondary | ICD-10-CM | POA: Diagnosis not present

## 2022-03-15 DIAGNOSIS — M9901 Segmental and somatic dysfunction of cervical region: Secondary | ICD-10-CM | POA: Diagnosis not present

## 2022-03-15 DIAGNOSIS — M9903 Segmental and somatic dysfunction of lumbar region: Secondary | ICD-10-CM | POA: Diagnosis not present

## 2022-03-15 DIAGNOSIS — S233XXA Sprain of ligaments of thoracic spine, initial encounter: Secondary | ICD-10-CM | POA: Diagnosis not present

## 2022-03-20 DIAGNOSIS — M9903 Segmental and somatic dysfunction of lumbar region: Secondary | ICD-10-CM | POA: Diagnosis not present

## 2022-03-20 DIAGNOSIS — M9902 Segmental and somatic dysfunction of thoracic region: Secondary | ICD-10-CM | POA: Diagnosis not present

## 2022-03-20 DIAGNOSIS — M9901 Segmental and somatic dysfunction of cervical region: Secondary | ICD-10-CM | POA: Diagnosis not present

## 2022-03-20 DIAGNOSIS — S233XXA Sprain of ligaments of thoracic spine, initial encounter: Secondary | ICD-10-CM | POA: Diagnosis not present

## 2022-04-03 DIAGNOSIS — M9902 Segmental and somatic dysfunction of thoracic region: Secondary | ICD-10-CM | POA: Diagnosis not present

## 2022-04-03 DIAGNOSIS — S233XXA Sprain of ligaments of thoracic spine, initial encounter: Secondary | ICD-10-CM | POA: Diagnosis not present

## 2022-04-03 DIAGNOSIS — M9903 Segmental and somatic dysfunction of lumbar region: Secondary | ICD-10-CM | POA: Diagnosis not present

## 2022-04-03 DIAGNOSIS — M9901 Segmental and somatic dysfunction of cervical region: Secondary | ICD-10-CM | POA: Diagnosis not present

## 2022-04-18 DIAGNOSIS — G4733 Obstructive sleep apnea (adult) (pediatric): Secondary | ICD-10-CM | POA: Diagnosis not present

## 2022-04-19 DIAGNOSIS — M9902 Segmental and somatic dysfunction of thoracic region: Secondary | ICD-10-CM | POA: Diagnosis not present

## 2022-04-19 DIAGNOSIS — M9901 Segmental and somatic dysfunction of cervical region: Secondary | ICD-10-CM | POA: Diagnosis not present

## 2022-04-19 DIAGNOSIS — M9903 Segmental and somatic dysfunction of lumbar region: Secondary | ICD-10-CM | POA: Diagnosis not present

## 2022-04-19 DIAGNOSIS — S233XXA Sprain of ligaments of thoracic spine, initial encounter: Secondary | ICD-10-CM | POA: Diagnosis not present

## 2022-05-18 DIAGNOSIS — G4733 Obstructive sleep apnea (adult) (pediatric): Secondary | ICD-10-CM | POA: Diagnosis not present

## 2022-06-18 DIAGNOSIS — G4733 Obstructive sleep apnea (adult) (pediatric): Secondary | ICD-10-CM | POA: Diagnosis not present

## 2022-07-28 DIAGNOSIS — G4733 Obstructive sleep apnea (adult) (pediatric): Secondary | ICD-10-CM | POA: Diagnosis not present

## 2023-01-29 ENCOUNTER — Telehealth: Payer: Self-pay

## 2023-01-29 ENCOUNTER — Other Ambulatory Visit: Payer: Self-pay

## 2023-01-29 NOTE — Telephone Encounter (Signed)
Arthur Koch retired from the [employer] on 08/11/2022 & is 67 years old & no longer on [employer].  Received a Rx refill request for Lisinopril-hydrochlorothiazide from [facility]).  Tried to call [name].  No answer.  Left message for him to call me back.  Charm Barges, RN
